# Patient Record
Sex: Male | Born: 1970
Health system: Southern US, Community
[De-identification: ages and names within clinical notes are randomized; demographics above are authoritative.]

## PROBLEM LIST (undated history)

## (undated) DIAGNOSIS — G4730 Sleep apnea, unspecified: Secondary | ICD-10-CM

## (undated) DIAGNOSIS — L409 Psoriasis, unspecified: Secondary | ICD-10-CM

## (undated) HISTORY — PX: PATELLAR TENDON REPAIR: SHX737

## (undated) HISTORY — PX: KNEE SURGERY: SHX244

---

## 2001-03-31 ENCOUNTER — Ambulatory Visit (HOSPITAL_BASED_OUTPATIENT_CLINIC_OR_DEPARTMENT_OTHER): Admission: RE | Admit: 2001-03-31 | Discharge: 2001-03-31 | Payer: Self-pay | Admitting: Orthopedic Surgery

## 2001-06-05 ENCOUNTER — Encounter: Admission: RE | Admit: 2001-06-05 | Discharge: 2001-06-19 | Payer: Self-pay | Admitting: Orthopedic Surgery

## 2004-02-16 ENCOUNTER — Ambulatory Visit (HOSPITAL_COMMUNITY): Admission: RE | Admit: 2004-02-16 | Discharge: 2004-02-16 | Payer: Self-pay | Admitting: Family Medicine

## 2004-02-29 ENCOUNTER — Ambulatory Visit (HOSPITAL_BASED_OUTPATIENT_CLINIC_OR_DEPARTMENT_OTHER): Admission: RE | Admit: 2004-02-29 | Discharge: 2004-02-29 | Payer: Self-pay | Admitting: Orthopedic Surgery

## 2015-01-12 ENCOUNTER — Encounter (HOSPITAL_COMMUNITY): Payer: Self-pay

## 2015-01-12 ENCOUNTER — Emergency Department (HOSPITAL_COMMUNITY)
Admission: EM | Admit: 2015-01-12 | Discharge: 2015-01-12 | Disposition: A | Discharge status: Home / Self Care | Payer: 59 | Attending: Emergency Medicine | Admitting: Emergency Medicine

## 2015-01-12 DIAGNOSIS — W540XXA Bitten by dog, initial encounter: Secondary | ICD-10-CM | POA: Insufficient documentation

## 2015-01-12 DIAGNOSIS — Y9389 Activity, other specified: Secondary | ICD-10-CM | POA: Diagnosis not present

## 2015-01-12 DIAGNOSIS — S71152A Open bite, left thigh, initial encounter: Secondary | ICD-10-CM

## 2015-01-12 DIAGNOSIS — Z79899 Other long term (current) drug therapy: Secondary | ICD-10-CM | POA: Diagnosis not present

## 2015-01-12 DIAGNOSIS — Z23 Encounter for immunization: Secondary | ICD-10-CM | POA: Insufficient documentation

## 2015-01-12 DIAGNOSIS — Y9289 Other specified places as the place of occurrence of the external cause: Secondary | ICD-10-CM | POA: Diagnosis not present

## 2015-01-12 DIAGNOSIS — Z7952 Long term (current) use of systemic steroids: Secondary | ICD-10-CM | POA: Insufficient documentation

## 2015-01-12 DIAGNOSIS — S71132A Puncture wound without foreign body, left thigh, initial encounter: Secondary | ICD-10-CM | POA: Diagnosis not present

## 2015-01-12 DIAGNOSIS — Y998 Other external cause status: Secondary | ICD-10-CM | POA: Diagnosis not present

## 2015-01-12 MED ORDER — AMOXICILLIN-POT CLAVULANATE 875-125 MG PO TABS: 1.0000 | ORAL_TABLET | Freq: Two times a day (BID) | ORAL | Status: DC

## 2015-01-12 MED ORDER — RABIES IMMUNE GLOBULIN 150 UNIT/ML IM INJ
20.0000 [IU]/kg | INJECTION | Freq: Once | INTRAMUSCULAR | Status: AC
  Administered 2015-01-12: 1350 [IU] via INTRAMUSCULAR
  Filled 2015-01-12 (×2): qty 9

## 2015-01-12 MED ORDER — AMOXICILLIN-POT CLAVULANATE 875-125 MG PO TABS
1.0000 | ORAL_TABLET | Freq: Once | ORAL | Status: AC
  Administered 2015-01-12: 1 via ORAL
  Filled 2015-01-12: qty 1

## 2015-01-12 MED ORDER — RABIES VACCINE, PCEC IM SUSR
1.0000 mL | Freq: Once | INTRAMUSCULAR | Status: AC
  Administered 2015-01-12: 1 mL via INTRAMUSCULAR
  Filled 2015-01-12: qty 1

## 2015-01-12 MED ORDER — LIDOCAINE-EPINEPHRINE-TETRACAINE (LET) SOLUTION
3.0000 mL | Freq: Once | NASAL | Status: AC
  Administered 2015-01-12: 3 mL via TOPICAL
  Filled 2015-01-12: qty 3

## 2015-01-12 MED ORDER — HYDROCODONE-ACETAMINOPHEN 5-325 MG PO TABS
1.0000 | ORAL_TABLET | Freq: Four times a day (QID) | ORAL | Status: DC | PRN
Start: 2015-01-12 — End: 2017-04-18

## 2015-01-12 MED ORDER — HYDROCODONE-ACETAMINOPHEN 5-325 MG PO TABS
2.0000 | ORAL_TABLET | Freq: Once | ORAL | Status: AC
  Administered 2015-01-12: 2 via ORAL
  Filled 2015-01-12: qty 2

## 2015-01-12 NOTE — ED Provider Notes (Signed)
CSN: [id number]     Arrival date & time 01/12/15  1001 History   First MD Initiated Contact with Patient 01/12/15 1008     Chief Complaint  Patient presents with  . Animal Bite     (Consider location/radiation/quality/duration/timing/severity/associated sxs/prior Treatment) HPI Comments: Patient is a 44 yo M presenting to the ED for evaluation of a dog bite to the left upper thigh that occurred just prior to arrival. Patient states his friends had to pull the dog off of his leg. The patient states he was twisting a lot to get away from the dog and is now sore on the left side of his chest wall and abdomen. Denies any other pain. He states he is able to control bleeding. The dog is owned by a family friend, vaccination status is unknown. Dog has been detained. Patient's tetanus shot is up-to-date.   History reviewed. No pertinent past medical history. Past Surgical History  Procedure Laterality Date  . Patellar tendon repair     History reviewed. No pertinent family history. History  Substance Use Topics  . Smoking status: Never Smoker   . Smokeless tobacco: Not on file  . Alcohol Use: No    Review of Systems  Skin: Positive for wound.  All other systems reviewed and are negative.     Allergies  Sulfa antibiotics  Home Medications   Prior to Admission medications   Medication Sig Start Date End Date Taking? Authorizing Provider  clobetasol cream (TEMOVATE) 0.05 % Apply 1 application topically daily. 01/03/15  Yes Historical Provider, MD  triamcinolone ointment (KENALOG) 0.1 % Apply 1 application topically daily. 11/09/14  Yes Historical Provider, MD  amoxicillin-clavulanate (AUGMENTIN) 875-125 MG per tablet Take 1 tablet by mouth every 12 (twelve) hours. 01/12/15   Dammon Makarewicz, PA-C  HYDROcodone-acetaminophen (NORCO/VICODIN) 5-325 MG per tablet Take 1-2 tablets by mouth every 6 (six) hours as needed for severe pain. 01/12/15   Gabriell Casimir, PA-C   BP 123/68  mmHg  Pulse 70  Temp(Src) 99.1 F (37.3 C) (Oral)  Resp 18  Wt 145 lb (65.772 kg)  SpO2 97% Physical Exam  Constitutional: He is oriented to person, place, and time. He appears well-developed and well-nourished. No distress.  HENT:  Head: Normocephalic and atraumatic.  Right Ear: External ear normal.  Left Ear: External ear normal.  Nose: Nose normal.  Mouth/Throat: Oropharynx is clear and moist.  Eyes: Conjunctivae are normal.  Neck: Normal range of motion. Neck supple.  Cardiovascular: Normal rate, regular rhythm and normal heart sounds.   Pulmonary/Chest: Effort normal and breath sounds normal.  Abdominal: Soft.  Musculoskeletal: Normal range of motion.  Neurological: He is alert and oriented to person, place, and time.  Skin: Skin is warm and dry. He is not diaphoretic.     Psychiatric: He has a normal mood and affect.  Nursing note and vitals reviewed.   ED Course  Procedures (including critical care time) Medications  rabies vaccine (RABAVERT) injection 1 mL (not administered)  rabies immune globulin (HYPERAB) injection 1,350 Units (not administered)  HYDROcodone-acetaminophen (NORCO/VICODIN) 5-325 MG per tablet 2 tablet (2 tablets Oral Given 01/12/15 1053)  lidocaine-EPINEPHrine-tetracaine (LET) solution (3 mLs Topical Given 01/12/15 1226)  amoxicillin-clavulanate (AUGMENTIN) 875-125 MG per tablet 1 tablet (1 tablet Oral Given 01/12/15 1053)    Labs Review Labs Reviewed - No data to display  Imaging Review No results found.   EKG Interpretation None      LACERATION REPAIR Performed by: Francee PiccoloPIEPENBRINK, Cayle Thunder L Authorized by:  Francee Piccolo L Consent: Verbal consent obtained. Risks and benefits: risks, benefits and alternatives were discussed Consent given by: patient Patient identity confirmed: provided demographic data Prepped and Draped in normal sterile fashion Wound explored  Laceration Location: left upper thigh  Laceration Length: 3  cm  No Foreign Bodies seen or palpated  Anesthesia: topical  Local anesthetic: LET  Anesthetic total: 3 ml  Irrigation method: syringe Amount of cleaning: standard  Skin closure: Staples  Number of sutures: 3  Technique: Staples  Patient tolerance: Patient tolerated the procedure well with no immediate complications.   Complications of closure of animal bite discussed with patient, who acknowledges this wound will only be loosely closed due to the extent of the wound as well as for cosmesis. He is agreeable to this plan.   MDM   Final diagnoses:  Animal bite of thigh, left, initial encounter    Filed Vitals:   01/12/15 1430  BP: 123/68  Pulse: 70  Temp:   Resp:    Afebrile, NAD, non-toxic appearing, AAOx4.  Neurovascularly intact. Normal sensation. No evidence of compartment syndrome. Tdap booster UTD. Wound cleaning complete with pressure irrigation, bottom of wound visualized, no foreign bodies appreciated. Laceration occurred < 8 hours prior to repair. Wound loosely closed.First dose of Augmentin given in ED for prophylaxis of infection from animal bite. Discussed staple home care w pt and answered questions. Pt to f-u for wound check in 1-2 days with staple removal in 10-14 days. Rabies vaccination given. Pt is hemodynamically stable w no complaints prior to dc.  Patient d/w with Dr. Donnald Garre, agrees with plan.       Francee Piccolo, PA-C 01/12/15 1600  Arby Barrette, MD 01/15/15 [phone]

## 2015-01-12 NOTE — ED Notes (Signed)
Suture cart at bedside 

## 2015-01-12 NOTE — ED Notes (Signed)
Assisted Erin, RN with irrigating wound and prepping for sutures/staples

## 2015-01-12 NOTE — ED Notes (Signed)
Pt given an urinal to use; visitors at bedside 

## 2015-01-12 NOTE — Discharge Instructions (Signed)
Please follow up with your primary care physician in 1-2 days. If you do not have one please call the [facility] number listed above. Please take your antibiotic until completion. Please take pain medication and/or muscle relaxants as prescribed and as needed for pain. Please do not drive on narcotic pain medication or on muscle relaxants. Please read all discharge instructions and return precautions.    Animal Bite An animal bite can result in a scratch on the skin, deep open cut, puncture of the skin, crush injury, or tearing away of the skin or a body part. Dogs are responsible for most animal bites. Children are bitten more often than adults. An animal bite can range from very mild to more serious. A small bite from your house pet is no cause for alarm. However, some animal bites can become infected or injure a bone or other tissue. You must seek medical care if:  The skin is broken and bleeding does not slow down or stop after 15 minutes.  The puncture is deep and difficult to clean (such as a cat bite).  Pain, warmth, redness, or pus develops around the wound.  The bite is from a stray animal or rodent. There may be a risk of rabies infection.  The bite is from a snake, raccoon, skunk, fox, coyote, or bat. There may be a risk of rabies infection.  The person bitten has a chronic illness such as diabetes, liver disease, or cancer, or the person takes medicine that lowers the immune system.  There is concern about the location and severity of the bite. It is important to clean and protect an animal bite wound right away to prevent infection. Follow these steps:  Clean the wound with plenty of water and soap.  Apply an antibiotic cream.  Apply gentle pressure over the wound with a clean towel or gauze to slow or stop bleeding.  Elevate the affected area above the heart to help stop any bleeding.  Seek medical care. Getting medical care within 8 hours of the  animal bite leads to the best possible outcome. DIAGNOSIS  Your caregiver will most likely:  Take a detailed history of the animal and the bite injury.  Perform a wound exam.  Take your medical history. Blood tests or X-rays may be performed. Sometimes, infected bite wounds are cultured and sent to a lab to identify the infectious bacteria.  TREATMENT  Medical treatment will depend on the location and type of animal bite as well as the patient's medical history. Treatment may include:  Wound care, such as cleaning and flushing the wound with saline solution, bandaging, and elevating the affected area.  Antibiotics.  Tetanus immunization.  Rabies immunization.  Leaving the wound open to heal. This is often done with animal bites, due to the high risk of infection. However, in certain cases, wound closure with stitches, wound adhesive, skin adhesive strips, or staples may be used. Infected bites that are left untreated may require intravenous (IV) antibiotics and surgical treatment in the hospital. HOME CARE INSTRUCTIONS  Follow your caregiver's instructions for wound care.  Take all medicines as directed.  If your caregiver prescribes antibiotics, take them as directed. Finish them even if you start to feel better.  Follow up with your caregiver for further exams or immunizations as directed. You may need a tetanus shot if:  You cannot remember when you had your last tetanus shot.  You have never had a tetanus shot.  The injury  broke your skin. If you get a tetanus shot, your arm may swell, get red, and feel warm to the touch. This is common and not a problem. If you need a tetanus shot and you choose not to have one, there is a rare chance of getting tetanus. Sickness from tetanus can be serious. SEEK MEDICAL CARE IF:  You notice warmth, redness, soreness, swelling, pus discharge, or a bad smell coming from the wound.  You have a red line on the skin coming from the  wound.  You have a fever, chills, or a general ill feeling.  You have nausea or vomiting.  You have continued or worsening pain.  You have trouble moving the injured part.  You have other questions or concerns. MAKE SURE YOU:  Understand these instructions.  Will watch your condition.  Will get help right away if you are not doing well or get worse. Document Released: 07/02/2011 Document Revised: 01/06/2012 Document Reviewed: 07/02/2011 [facility] Patient Information 2015 [place], [place]. This information is not intended to replace advice given to you by your health care provider. Make sure you discuss any questions you have with your health care provider. Staple Care and Removal Your caregiver has used staples today to repair your wound. Staples are used to help a wound heal faster by holding the edges of the wound together. The staples can be removed when the wound has healed well enough to stay together after the staples are removed. A dressing (wound covering), depending on the location of the wound, may have been applied. This may be changed once per day or as instructed. If the dressing sticks, it may be soaked off with soapy water or hydrogen peroxide. Only take over-the-counter or prescription medicines for pain, discomfort, or fever as directed by your caregiver.  If you did not receive a tetanus shot today because you did not recall when your last one was given, check with your caregiver when you have your staples removed to determine if one is needed. Return to your caregiver's office in 1 week or as suggested to have your staples removed. SEEK IMMEDIATE MEDICAL CARE IF:   You have redness, swelling, or increasing pain in the wound.  You have pus coming from the wound.  You have a fever.  You notice a bad smell coming from the wound or dressing.  Your wound edges break open after staples have been removed. Document Released: 07/09/2001 Document Revised: 01/06/2012  Document Reviewed: 07/24/2005 [facility] Patient Information 2015 [place], [place]. This information is not intended to replace advice given to you by your health care provider. Make sure you discuss any questions you have with your health care provider.

## 2015-01-12 NOTE — ED Notes (Signed)
Pt reports he was hanging out with friends when a friends dog bit him in the left upper thigh.  Pt has open puncture wound to left upper thigh.  Bleeding is controlled.  Unknown vaccination status of dog.

## 2015-01-12 NOTE — ED Notes (Signed)
Wound cleaned and sterile dressing applied.

## 2015-01-15 ENCOUNTER — Emergency Department (HOSPITAL_COMMUNITY)
Admission: EM | Admit: 2015-01-15 | Discharge: 2015-01-15 | Disposition: A | Discharge status: Home / Self Care | Payer: 59 | Source: Home / Self Care

## 2015-01-15 ENCOUNTER — Encounter (HOSPITAL_COMMUNITY): Payer: Self-pay | Admitting: *Deleted

## 2015-01-15 DIAGNOSIS — Z203 Contact with and (suspected) exposure to rabies: Secondary | ICD-10-CM

## 2015-01-15 HISTORY — DX: Psoriasis, unspecified: L40.9

## 2015-01-15 MED ORDER — RABIES VACCINE, PCEC IM SUSR
INTRAMUSCULAR | Status: AC
  Filled 2015-01-15: qty 1

## 2015-01-15 MED ORDER — RABIES VACCINE, PCEC IM SUSR
1.0000 mL | Freq: Once | INTRAMUSCULAR | Status: AC
  Administered 2015-01-15: 1 mL via INTRAMUSCULAR

## 2015-01-15 NOTE — ED Notes (Signed)
Presents for rabies vaccine.  No c/o's.

## 2015-01-15 NOTE — Discharge Instructions (Signed)
Return 3/24 for next injection.

## 2015-01-19 ENCOUNTER — Encounter (HOSPITAL_COMMUNITY): Payer: Self-pay | Admitting: *Deleted

## 2015-01-19 ENCOUNTER — Emergency Department (HOSPITAL_COMMUNITY)
Admission: EM | Admit: 2015-01-19 | Discharge: 2015-01-19 | Disposition: A | Discharge status: Home / Self Care | Payer: 59 | Source: Home / Self Care

## 2015-01-19 MED ORDER — RABIES VACCINE, PCEC IM SUSR: 1.0000 mL | Freq: Once | INTRAMUSCULAR | Status: DC

## 2015-01-19 NOTE — ED Notes (Signed)
Pt  Phoned     Stating  That  Animal control   Had  Advised  Him   That  He  Did  Not  Need  The  Rest  Of  His   Rabies     shots        -  He  Was  Advised  That       -         If  Any  Change  Let  [place]  Know

## 2015-01-19 NOTE — ED Notes (Signed)
Pt  Not  In  Waiting  Room     Checked  X  2

## 2017-04-18 ENCOUNTER — Encounter (HOSPITAL_COMMUNITY): Payer: Self-pay | Admitting: *Deleted

## 2017-04-18 ENCOUNTER — Ambulatory Visit (HOSPITAL_COMMUNITY)
Admission: EM | Admit: 2017-04-18 | Discharge: 2017-04-18 | Disposition: A | Discharge status: Home / Self Care | Payer: BLUE CROSS/BLUE SHIELD | Attending: Family Medicine | Admitting: Family Medicine

## 2017-04-18 DIAGNOSIS — J020 Streptococcal pharyngitis: Secondary | ICD-10-CM

## 2017-04-18 MED ORDER — AMOXICILLIN 875 MG PO TABS: 875.0000 mg | ORAL_TABLET | Freq: Two times a day (BID) | ORAL | 0 refills | Status: DC

## 2017-04-18 NOTE — ED Triage Notes (Signed)
Pt   Reports    sorethroat     Since  yest  Wife  Has     Similar  Symptoms

## 2017-04-18 NOTE — ED Provider Notes (Signed)
[facility]    CSN: [id number] Arrival date & time: 04/18/17  1605     History   Chief Complaint No chief complaint on file.   HPI Jared Stewart is a 46 y.o. male.   Is a 46 year old man who comes in for evaluation of sore throat. It began this morning and has gotten progressively worse all day associated with fatigue and fever. His wife has been tested positive for strep      Past Medical History:  Diagnosis Date  . Psoriasis     There are no active problems to display for this patient.   Past Surgical History:  Procedure Laterality Date  . KNEE SURGERY    . PATELLAR TENDON REPAIR         Home Medications    Prior to Admission medications   Medication Sig Start Date End Date Taking? Authorizing Provider  amoxicillin (AMOXIL) 875 MG tablet Take 1 tablet (875 mg total) by mouth 2 (two) times daily. 04/18/17   Elvina SidleLauenstein, Rache Klimaszewski, MD    Family History No family history on file.  Social History Social History  Substance Use Topics  . Smoking status: Never Smoker  . Smokeless tobacco: Not on file  . Alcohol use No     Allergies   Sulfa antibiotics   Review of Systems Review of Systems  Constitutional: Positive for fatigue and fever.  HENT: Positive for sore throat.      Physical Exam Triage Vital Signs ED Triage Vitals  Enc Vitals Group     BP      Pulse      Resp      Temp      Temp src      SpO2      Weight      Height      Head Circumference      Peak Flow      Pain Score      Pain Loc      Pain Edu?      Excl. in GC?    No data found.   Updated Vital Signs There were no vitals taken for this visit.  Physical Exam  Constitutional: He is oriented to person, place, and time. He appears well-developed and well-nourished.  HENT:  Right Ear: External ear normal.  Left Ear: External ear normal.  And erythematous posterior pharynx  Eyes: Conjunctivae are normal. Pupils are equal, round, and reactive to light.  Neck:  Normal range of motion. Neck supple.  Pulmonary/Chest: Effort normal.  Musculoskeletal: Normal range of motion.  Neurological: He is alert and oriented to person, place, and time.  Skin: Skin is warm and dry.  Nursing note and vitals reviewed.    UC Treatments / Results  Labs (all labs ordered are listed, but only abnormal results are displayed) Labs Reviewed - No data to display  EKG  EKG Interpretation None       Radiology No results found.  Procedures Procedures (including critical care time)  Medications Ordered in UC Medications - No data to display   Initial Impression / Assessment and Plan / UC Course  I have reviewed the triage vital signs and the nursing notes.  Pertinent labs & imaging results that were available during my care of the patient were reviewed by me and considered in my medical decision making (see chart for details).     Final Clinical Impressions(s) / UC Diagnoses   Final diagnoses:  Streptococcal sore throat  New Prescriptions New Prescriptions   AMOXICILLIN (AMOXIL) 875 MG TABLET    Take 1 tablet (875 mg total) by mouth 2 (two) times daily.     Elvina Sidle, MD 04/18/17 [id number]

## 2017-07-17 MED FILL — VALACYCLOVIR HCL 500 MG TAB: 500 | 9 days supply | Qty: 18 | Fill #0

## 2017-10-03 MED FILL — CLOBETASOL PROPIONATE 0.05: 0.05 | 20 days supply | Qty: 60 | Fill #0

## 2017-10-03 MED FILL — VALACYCLOVIR HCL 500 MG TAB: 500 | 9 days supply | Qty: 18 | Fill #1

## 2017-12-19 MED FILL — VALACYCLOVIR HCL 500 MG TAB: 500 | 9 days supply | Qty: 18 | Fill #2

## 2018-02-12 ENCOUNTER — Encounter (HOSPITAL_BASED_OUTPATIENT_CLINIC_OR_DEPARTMENT_OTHER): Payer: Self-pay

## 2018-02-12 DIAGNOSIS — R5383 Other fatigue: Secondary | ICD-10-CM

## 2018-02-12 DIAGNOSIS — G4710 Hypersomnia, unspecified: Secondary | ICD-10-CM

## 2018-02-16 ENCOUNTER — Encounter (HOSPITAL_BASED_OUTPATIENT_CLINIC_OR_DEPARTMENT_OTHER): Payer: No Typology Code available for payment source

## 2018-03-16 ENCOUNTER — Ambulatory Visit (HOSPITAL_BASED_OUTPATIENT_CLINIC_OR_DEPARTMENT_OTHER): Payer: No Typology Code available for payment source | Attending: Internal Medicine | Admitting: Internal Medicine

## 2018-03-16 VITALS — Ht 72.0 in | Wt 184.0 lb

## 2018-03-16 DIAGNOSIS — G4730 Sleep apnea, unspecified: Secondary | ICD-10-CM | POA: Diagnosis not present

## 2018-03-16 DIAGNOSIS — R400 Somnolence: Secondary | ICD-10-CM | POA: Diagnosis present

## 2018-03-16 DIAGNOSIS — R5383 Other fatigue: Secondary | ICD-10-CM | POA: Insufficient documentation

## 2018-03-16 DIAGNOSIS — G4710 Hypersomnia, unspecified: Secondary | ICD-10-CM

## 2018-03-16 DIAGNOSIS — G4733 Obstructive sleep apnea (adult) (pediatric): Secondary | ICD-10-CM

## 2018-03-26 MED FILL — CLOBETASOL PROPIONATE 0.05: 0.05 | 20 days supply | Qty: 60 | Fill #1

## 2018-03-26 MED FILL — VALACYCLOVIR HCL 500 MG TAB: 500 | 9 days supply | Qty: 18 | Fill #3

## 2018-03-29 NOTE — Procedures (Signed)
    NAME: Jared Stewart DATE OF BIRTH:  1971/04/19 MEDICAL RECORD NUMBER [id number]  LOCATION: [facility]  PHYSICIAN: Deretha EmoryJames C Maciah Schweigert  DATE OF STUDY: 03/16/2018  SLEEP STUDY TYPE: Out of Center Sleep Test                REFERRING PHYSICIAN: Deretha Emorysborne, Makhya Arave C, MD/D. Clovis RileyMitchell, MD  INDICATION FOR STUDY: excessive daytime sleepiness, nocturia, non-restorative sleep  EPWORTH SLEEPINESS SCORE:  13 HEIGHT: 6' (182.9 cm)  WEIGHT: 184 lb (83.5 kg)    Body mass index is 24.95 kg/m.  NECK SIZE: 14 in.  I have reviewed the patient's medications in eCW.   Conclusions: 1. Very mild sleep apnea (Respiratory Event Index (REI) 8.7/hr) - please note that REI approximates AHI but is not identical as a HST measures time of use and not time of sleep. 2. No significant desaturations (min O2 sat: 85%; time below 89%: 0 minutes [0% of monitored time]) 3. Qualitative RERA estimate: high  Recommendations: 1. The patient has excessive daytime sleepiness. His AHI is not very high, however, there did appear to be a large number of RERAs. Given his daytime sleepiness, the patient should be treated with weight loss (if appropriate), an oral appliance, or CPAP. If CPAP is chosen, this could be started by auto-CPAP or by CPAP titration in the lab.  2. Please note that a HST may underestimate the degree of obstructive sleep apnea due to the lack of EEG data. Therefore, respiratory effort related arousals (RERAs) will not be measured. If a prominent part of the patient's sleep disordered breathing are RERAs, then the HST will understate the severity of the patient's obstructive sleep apnea.   This study is a Type III Home Sleep Apnea Test measuring respiratory effort, airflow, heart rate, and oxygen saturation.   I certify that I have reviewed the entire raw data recording prior to the issuance of this report in accordance with the Standards of the American Academy of Sleep Medicine (AASM).    Deretha EmoryJames C Lavora Brisbon Sleep specialist, American Board of Internal Medicine  ELECTRONICALLY SIGNED ON:  03/29/2018, 9:47 PM  [facility] PH: [phone]   FX: [phone] ACCREDITED BY THE AMERICAN ACADEMY OF SLEEP MEDICINE

## 2018-04-25 ENCOUNTER — Ambulatory Visit (INDEPENDENT_AMBULATORY_CARE_PROVIDER_SITE_OTHER): Payer: Self-pay | Admitting: Family Medicine

## 2018-04-25 VITALS — BP 110/70 | HR 98 | Temp 98.4°F | Resp 20 | Ht 72.0 in | Wt 191.2 lb

## 2018-04-25 DIAGNOSIS — Z0000 Encounter for general adult medical examination without abnormal findings: Secondary | ICD-10-CM

## 2018-04-25 NOTE — Patient Instructions (Signed)

## 2018-04-25 NOTE — Progress Notes (Signed)
Jared Stewart is a 47 y.o. male who presents today with concerns of need for an employee annual physical exam.Patient denies any acute conditions that need to be addressed today this visit.  Review of Systems  Constitutional: Negative for chills, fever and malaise/fatigue.  HENT: Negative for congestion, ear discharge, ear pain, sinus pain and sore throat.   Eyes: Negative.   Respiratory: Negative for cough, sputum production and shortness of breath.   Cardiovascular: Negative.  Negative for chest pain.  Gastrointestinal: Negative for abdominal pain, diarrhea, nausea and vomiting.  Genitourinary: Negative for dysuria, frequency, hematuria and urgency.  Musculoskeletal: Negative for myalgias.  Skin: Negative.   Neurological: Negative for headaches.  Endo/Heme/Allergies: Negative.   Psychiatric/Behavioral: Negative.     O: Vitals:   04/25/18 1202  BP: 110/70  Pulse: 98  Resp: 20  Temp: 98.4 F (36.9 C)  SpO2: 98%     Physical Exam  Constitutional: He is oriented to person, place, and time. Vital signs are normal. He appears well-developed and well-nourished. He is active.  Non-toxic appearance. He does not have a sickly appearance.  HENT:  Head: Normocephalic.  Right Ear: Hearing, tympanic membrane, external ear and ear canal normal.  Left Ear: Hearing, tympanic membrane, external ear and ear canal normal.  Nose: Nose normal.  Mouth/Throat: Uvula is midline and oropharynx is clear and moist.  Neck: Normal range of motion. Neck supple.  Cardiovascular: Normal rate, regular rhythm, normal heart sounds and normal pulses.  Pulmonary/Chest: Effort normal and breath sounds normal.  Abdominal: Soft. Bowel sounds are normal.  Musculoskeletal: Normal range of motion.  Lymphadenopathy:       Head (right side): No submental and no submandibular adenopathy present.       Head (left side): No submental and no submandibular adenopathy present.    He has no cervical adenopathy.   Neurological: He is alert and oriented to person, place, and time.  Psychiatric: He has a normal mood and affect. His speech is normal and behavior is normal. Cognition and memory are normal.  PHQ-9 negative  Vitals reviewed.  A: 1. Physical exam    P: Exam findings, diagnosis etiology and medication use and indications reviewed with patient. Follow- Up and discharge instructions provided. No emergent/urgent issues found on exam.  Patient verbalized understanding of information provided and agrees with plan of care (POC), all questions answered.  1. Physical exam

## 2018-06-10 ENCOUNTER — Ambulatory Visit (INDEPENDENT_AMBULATORY_CARE_PROVIDER_SITE_OTHER): Payer: Self-pay | Admitting: Nurse Practitioner

## 2018-06-10 VITALS — BP 120/70 | HR 69 | Temp 98.2°F | Resp 16 | Wt 194.0 lb

## 2018-06-10 DIAGNOSIS — H00012 Hordeolum externum right lower eyelid: Secondary | ICD-10-CM

## 2018-06-10 MED ORDER — ERYTHROMYCIN 5 MG/GM OP OINT: 1.0000 "application " | TOPICAL_OINTMENT | Freq: Every day | OPHTHALMIC | 0 refills | Status: AC

## 2018-06-10 MED FILL — ERYTHROMYCIN EYE OINTMENT: 5 | 10 days supply | Qty: 4 | Fill #0

## 2018-06-10 NOTE — Progress Notes (Signed)
   Subjective:    Patient ID: Jared Stewart, male    DOB: 01/30/1971, 47 y.o.   MRN: [id number]  The patient is a 47 year old male who presents today for complaints of right eye swelling.  The patient states his symptoms started 1 day ago.  Patient denies any fever, chills, cough, congestion, runny nose.  The patient also denies any crusting or drainage from the right eye.  The patient states that this morning it was lightly matted but not to the point where it looks like it was infected.  The patient denies pain at this time, but does have some discomfort to the lower lid.  The patient also does not wear contact lenses, and denies any history of injury or foreign body.  The patient has use warm compresses to the right eye since the onset for with some improvement.  Eye Pain   The right eye is affected. This is a new problem. The current episode started yesterday. The problem occurs constantly. The problem has been gradually improving. There was no injury mechanism. The pain is at a severity of 1/10. The pain is mild. There is no known exposure to pink eye. He does not wear contacts. Associated symptoms include eye redness. Pertinent negatives include no blurred vision, eye discharge, double vision, foreign body sensation, itching or photophobia. Associated symptoms comments: Lower lid swelling. The treatment provided mild relief.    Review of Systems  Constitutional: Negative.   HENT: Negative.   Eyes: Positive for pain and redness. Negative for blurred vision, double vision, photophobia, discharge, itching and visual disturbance.  Respiratory: Negative.   Cardiovascular: Negative.   Skin: Negative.       Objective:   Physical Exam  Constitutional: He appears well-developed and well-nourished. No distress.  HENT:  Head: Normocephalic and atraumatic.  Right Ear: External ear normal.  Left Ear: External ear normal.  Nose: Nose normal.  Mouth/Throat: Oropharynx is clear and moist.  Eyes:  Pupils are equal, round, and reactive to light. Conjunctivae and EOM are normal. Right eye exhibits no discharge.  Right lower lid swelling       Assessment & Plan:  Right Hordeolum  Exam findings, diagnosis etiology and medication use and indications reviewed with patient. Follow- Up and discharge instructions provided. No emergent/urgent issues found on exam.  Patient verbalized understanding of information provided and agrees with plan of care (POC), all questions answered.  1. Hordeolum externum of right lower eyelid  - erythromycin ophthalmic ointment; Place 1 application into the right eye at bedtime for 10 days.  Dispense: 10 g; Refill: 0 -Apply antibiotic ointment as prescribed. -Apply warm compresses to the right eye to relieve swelling. -Ibuprofen or Tylenol for pain, fever or general discomfort. -Follow up in the ER if you have sudden loss of vision or change in vision.

## 2018-06-10 NOTE — Patient Instructions (Signed)
Stye -Apply antibiotic ointment as prescribed. -Apply warm compresses to the right eye to relieve swelling. -Ibuprofen or Tylenol for pain, fever or general discomfort. -Follow up in the ER if you have sudden loss of vision or change in vision.  A stye is a bump on your eyelid caused by a bacterial infection. A stye can form inside the eyelid (internal stye) or outside the eyelid (external stye). An internal stye may be caused by an infected oil-producing gland inside your eyelid. An external stye may be caused by an infection at the base of your eyelash (hair follicle). Styes are very common. Anyone can get them at any age. They usually occur in just one eye, but you may have more than one in either eye. What are the causes? The infection is almost always caused by bacteria called Staphylococcus aureus. This is a common type of bacteria that lives on your skin. What increases the risk? You may be at higher risk for a stye if you have had one before. You may also be at higher risk if you have:  Diabetes.  Long-term illness.  Long-term eye redness.  A skin condition called seborrhea.  High fat levels in your blood (lipids).  What are the signs or symptoms? Eyelid pain is the most common symptom of a stye. Internal styes are more painful than external styes. Other signs and symptoms may include:  Painful swelling of your eyelid.  A scratchy feeling in your eye.  Tearing and redness of your eye.  Pus draining from the stye.  How is this diagnosed? Your health care provider may be able to diagnose a stye just by examining your eye. The health care provider may also check to make sure:  You do not have a fever or other signs of a more serious infection.  The infection has not spread to other parts of your eye or areas around your eye.  How is this treated? Most styes will clear up in a few days without treatment. In some cases, you may need to use antibiotic drops or ointment to  prevent infection. Your health care provider may have to drain the stye surgically if your stye is:  Large.  Causing a lot of pain.  Interfering with your vision.  This can be done using a thin blade or a needle. Follow these instructions at home:  Take medicines only as directed by your health care provider.  Apply a clean, warm compress to your eye for 10 minutes, 4 times a day.  Do not wear contact lenses or eye makeup until your stye has healed.  Do not try to pop or drain the stye. Contact a health care provider if:  You have chills or a fever.  Your stye does not go away after several days.  Your stye affects your vision.  Your eyeball becomes swollen, red, or painful. This information is not intended to replace advice given to you by your health care provider. Make sure you discuss any questions you have with your health care provider. Document Released: 07/24/2005 Document Revised: 06/09/2016 Document Reviewed: 01/28/2014 Elsevier Interactive Patient Education  [employer]2018 Elsevier Inc.

## 2018-07-13 MED FILL — CLOBETASOL PROPIONATE 0.05: 0.05 | 20 days supply | Qty: 60 | Fill #2

## 2018-07-14 MED FILL — VALACYCLOVIR HCL 500 MG TAB: 500 | 9 days supply | Qty: 18 | Fill #0

## 2018-07-22 MED FILL — VALACYCLOVIR HCL 500 MG TAB: 500 | 90 days supply | Qty: 90 | Fill #0

## 2018-08-12 MED FILL — ZOLPIDEM TARTRATE 5 MG TAB: 5 | 30 days supply | Qty: 30 | Fill #0

## 2018-10-12 MED FILL — VALACYCLOVIR HCL 500 MG TAB: 500 | 90 days supply | Qty: 90 | Fill #1

## 2018-10-13 MED FILL — ZOLPIDEM TARTRATE 5 MG TAB: 5 | 30 days supply | Qty: 30 | Fill #0

## 2019-01-15 MED FILL — VALACYCLOVIR HCL 500 MG TAB: 500 | 90 days supply | Qty: 90 | Fill #2

## 2019-02-04 MED FILL — ZOLPIDEM TARTRATE 5 MG TAB: 5 | 30 days supply | Qty: 30 | Fill #0

## 2019-02-09 ENCOUNTER — Encounter: Payer: Self-pay | Admitting: Pharmacist

## 2019-02-09 ENCOUNTER — Other Ambulatory Visit: Payer: Self-pay

## 2019-02-09 ENCOUNTER — Ambulatory Visit (INDEPENDENT_AMBULATORY_CARE_PROVIDER_SITE_OTHER): Payer: No Typology Code available for payment source | Admitting: Pharmacist

## 2019-02-09 DIAGNOSIS — Z79899 Other long term (current) drug therapy: Secondary | ICD-10-CM

## 2019-02-09 MED ORDER — SECUKINUMAB (300 MG DOSE) 150 MG/ML ~~LOC~~ SOAJ: 300.0000 mg | SUBCUTANEOUS | 0 refills | Status: DC

## 2019-02-09 MED ORDER — SECUKINUMAB (300 MG DOSE) 150 MG/ML ~~LOC~~ SOAJ: 300.0000 mg | SUBCUTANEOUS | 2 refills | Status: DC

## 2019-02-09 MED FILL — COSENTYX 300 MG DOSE-2 PENS: 150 | 35 days supply | Qty: 10 | Fill #0

## 2019-02-09 NOTE — Progress Notes (Signed)
   S: Patient presents today for review of their specialty medication. His wife is present for visit.   Patient is currently taking Cosentyx (secukinumab) for psoriasis. Patient is managed by Dr. Sharyn Lull for this.   Dosing: Plaque psoriasis: SubQ: 300 mg once weekly at weeks 0, 1, 2, 3, and 4 followed by 300 mg every 4 weeks. Some patients may only require 150 mg per dose.  Adherence: has not started yet  Efficacy: has not started yet  Current adverse effects: S/sx of infection: denies GI upset: has not started Headache: has not started S/sx of hypersensitivity: has not started    O:     No results found for: WBC, HGB, HCT, MCV, PLT    Chemistry   No results found for: NA, K, CL, CO2, BUN, CREATININE, GLU No results found for: CALCIUM, ALKPHOS, AST, ALT, BILITOT     A/P: 1. Medication review: Patient is currently on Cosentyx for psoriasis but has not started it yet. He plans to go to the office for the first administration though his wife is a Engineer, civil (consulting) and feels comfortable giving him the injection if needed. Reviewed the medication with the patient, including the following: Cosentyx is a monoclonal antibody used in the treatment of ankylosing spondylitis, psoriasis, and psoriatic arthritis. The injection is subq and the medication should be allowed to reach room temp prior to injecting. Injection sites should be rotated. Possible adverse effects include headaches, GI upset, increased risk of infection and hypersensitivity reactions. Stressed importance to good infection prevention technique during COVID19 pandemic. No recommendations for any changes at this time.  Alvino Blood, PharmD, BCPS, BCACP, CPP Clinical Pharmacist Practitioner  [phone]

## 2019-04-01 MED FILL — COSENTYX 300 MG DOSE-2 PENS: 150 | 28 days supply | Qty: 2 | Fill #0

## 2019-04-16 MED FILL — VALACYCLOVIR HCL 500 MG TAB: 500 | 90 days supply | Qty: 90 | Fill #3

## 2019-04-16 MED FILL — ZOLPIDEM TARTRATE 5 MG TAB: 5 | 30 days supply | Qty: 30 | Fill #0

## 2019-05-03 MED FILL — COSENTYX 300 MG DOSE-2 PENS: 150 | 28 days supply | Qty: 2 | Fill #1

## 2019-05-31 MED FILL — COSENTYX 300 MG DOSE-2 PENS: 150 | 28 days supply | Qty: 2 | Fill #2

## 2019-06-30 ENCOUNTER — Other Ambulatory Visit: Payer: Self-pay | Admitting: Pharmacist

## 2019-06-30 MED ORDER — COSENTYX SENSOREADY (300 MG) 150 MG/ML ~~LOC~~ SOAJ: 300.0000 mg | SUBCUTANEOUS | 1 refills | Status: DC

## 2019-06-30 MED FILL — COSENTYX 300 MG DOSE-2 PENS: 150 | 28 days supply | Qty: 2 | Fill #0

## 2019-07-20 ENCOUNTER — Other Ambulatory Visit: Payer: Self-pay | Admitting: Pharmacist

## 2019-07-20 MED ORDER — COSENTYX SENSOREADY (300 MG) 150 MG/ML ~~LOC~~ SOAJ: 300.0000 mg | SUBCUTANEOUS | 5 refills | Status: DC

## 2019-07-21 MED FILL — COSENTYX 300 MG DOSE-2 PENS: 150 | 28 days supply | Qty: 2 | Fill #0

## 2019-07-28 MED FILL — CLOBETASOL PROPIONATE 0.05: 0.05 | 14 days supply | Qty: 15 | Fill #0

## 2019-08-13 MED FILL — ZOLPIDEM TARTRATE 5 MG TAB: 5 | 30 days supply | Qty: 30 | Fill #0

## 2019-08-20 MED FILL — VALACYCLOVIR HCL 500 MG TAB: 500 | 90 days supply | Qty: 90 | Fill #0

## 2019-08-24 MED FILL — COSENTYX 300 MG DOSE-2 PENS: 150 | 28 days supply | Qty: 2 | Fill #0

## 2019-09-16 MED FILL — COSENTYX 300 MG DOSE-2 PENS: 150 | 28 days supply | Qty: 2 | Fill #0

## 2019-10-05 ENCOUNTER — Other Ambulatory Visit: Payer: Self-pay

## 2019-10-05 DIAGNOSIS — Z20822 Contact with and (suspected) exposure to covid-19: Secondary | ICD-10-CM

## 2019-10-07 LAB — NOVEL CORONAVIRUS, NAA: SARS-CoV-2, NAA: NOT DETECTED

## 2019-10-18 MED FILL — COSENTYX 300 MG DOSE-2 PENS: 150 | 28 days supply | Qty: 2 | Fill #0

## 2019-11-12 MED FILL — COSENTYX 300 MG DOSE-2 PENS: 150 | 28 days supply | Qty: 2 | Fill #1

## 2019-12-06 ENCOUNTER — Other Ambulatory Visit: Payer: Self-pay

## 2019-12-06 ENCOUNTER — Encounter: Payer: Self-pay | Admitting: Emergency Medicine

## 2019-12-06 ENCOUNTER — Ambulatory Visit
Admission: EM | Admit: 2019-12-06 | Discharge: 2019-12-06 | Disposition: A | Discharge status: Home / Self Care | Payer: No Typology Code available for payment source

## 2019-12-06 DIAGNOSIS — W1809XA Striking against other object with subsequent fall, initial encounter: Secondary | ICD-10-CM

## 2019-12-06 DIAGNOSIS — S069X0A Unspecified intracranial injury without loss of consciousness, initial encounter: Secondary | ICD-10-CM

## 2019-12-06 DIAGNOSIS — R5383 Other fatigue: Secondary | ICD-10-CM

## 2019-12-06 DIAGNOSIS — R454 Irritability and anger: Secondary | ICD-10-CM

## 2019-12-06 DIAGNOSIS — H53149 Visual discomfort, unspecified: Secondary | ICD-10-CM

## 2019-12-06 NOTE — ED Provider Notes (Signed)
[facility]    CSN: [id number] Arrival date & time: 12/06/19  1005      History   Chief Complaint Chief Complaint  Patient presents with  . Head Injury    HPI Wagner Tanzi is a 49 y.o. male presenting for evaluation of persistent fatigue, photophobia, mood swings, nausea status post fall.  Patient states he tripped up the stairs (last 7) and hit his head and lip on a window frame Friday night.  Patient denies LOC, the states he immediately had some tremors of his left side, cold chills, dizziness, nausea.  Has had headaches, dizziness and fatigue since.  Has been using ice, Tylenol, ibuprofen with some relief of symptoms.  Endorsing photophobia, photophobia.  States he feels fatigued after doing something for about 10 to 15 minutes to the point where he feels he could go to sleep, though denies chest pain, palpitations, difficulty breathing.  Notes that his wife feels he has been having more mood swings and been more emotional/depressed.  Patient denies SI/HI.   Past Medical History:  Diagnosis Date  . Psoriasis     There are no problems to display for this patient.   Past Surgical History:  Procedure Laterality Date  . KNEE SURGERY    . PATELLAR TENDON REPAIR         Home Medications    Prior to Admission medications   Medication Sig Start Date End Date Taking? Authorizing Provider  aspirin EC 81 MG tablet Take 81 mg by mouth daily.    [provider]  Secukinumab, 300 MG Dose, (COSENTYX SENSOREADY, 300 MG,) 150 MG/ML SOAJ Inject 300 mg into the skin every 28 (twenty-eight) days. Inject 2 pens starting at day 28 (dose 5), then every 4 weeks as directed 02/09/19   Jeanann Lewandowsky E, MD  Secukinumab, 300 MG Dose, (COSENTYX SENSOREADY, 300 MG,) 150 MG/ML SOAJ Inject 300 mg into the skin every 28 (twenty-eight) days. 07/20/19   Quentin Angst, MD    Family History Family History  Family history unknown: Yes    Social History Social History     Tobacco Use  . Smoking status: Never Smoker  . Smokeless tobacco: Never Used  Substance Use Topics  . Alcohol use: No  . Drug use: Yes    Types: Marijuana     Allergies   Sulfa antibiotics   Review of Systems As per HPI   Physical Exam Triage Vital Signs ED Triage Vitals  Enc Vitals Group     BP 12/06/19 1013 (!) 138/94     Pulse Rate 12/06/19 1013 84     Resp 12/06/19 1013 18     Temp 12/06/19 1013 99 F (37.2 C)     Temp Source 12/06/19 1013 Temporal     SpO2 12/06/19 1013 97 %     Weight --      Height --      Head Circumference --      Peak Flow --      Pain Score 12/06/19 1016 1     Pain Loc --      Pain Edu? --      Excl. in GC? --    No data found.  Updated Vital Signs BP (!) 138/94 (BP Location: Left Arm)   Pulse 84   Temp 99 F (37.2 C) (Temporal)   Resp 18   SpO2 97%   Visual Acuity Right Eye Distance:   Left Eye Distance:   Bilateral Distance:  Right Eye Near:   Left Eye Near:    Bilateral Near:     Physical Exam Constitutional:      General: He is not in acute distress. HENT:     Head: Normocephalic and atraumatic.     Right Ear: Tympanic membrane, ear canal and external ear normal.     Left Ear: Tympanic membrane, ear canal and external ear normal.     Mouth/Throat:     Mouth: Mucous membranes are moist.     Pharynx: Oropharynx is clear.  Eyes:     General: No scleral icterus.    Extraocular Movements: Extraocular movements intact.     Conjunctiva/sclera: Conjunctivae normal.     Pupils: Pupils are equal, round, and reactive to light.     Comments: Patient squints when shining light in eyes bilaterally, though does not appear to be bothered by fluorescent lighting, or during EOM exam  Cardiovascular:     Rate and Rhythm: Normal rate.  Pulmonary:     Effort: Pulmonary effort is normal. No respiratory distress.     Breath sounds: No wheezing.  Musculoskeletal:        General: No deformity. Normal range of motion.      Cervical back: Normal range of motion. No rigidity or tenderness.  Lymphadenopathy:     Cervical: No cervical adenopathy.  Skin:    Capillary Refill: Capillary refill takes less than 2 seconds.     Coloration: Skin is not jaundiced.     Findings: No bruising or rash.  Neurological:     Mental Status: He is alert and oriented to person, place, and time.     Cranial Nerves: Cranial nerves are intact.     Sensory: Sensation is intact.     Motor: Motor function is intact. No tremor or abnormal muscle tone.     Coordination: Coordination is intact. Heel to [facility] Test normal. Rapid alternating movements normal.     Gait: Gait is intact. Gait normal.     Deep Tendon Reflexes:     Reflex Scores:      Brachioradialis reflexes are 2+ on the right side and 2+ on the left side.      Patellar reflexes are 1+ on the right side and 1+ on the left side.      Achilles reflexes are 1+ on the right side and 1+ on the left side. Psychiatric:        Mood and Affect: Mood normal.        Behavior: Behavior normal.      UC Treatments / Results  Labs (all labs ordered are listed, but only abnormal results are displayed) Labs Reviewed - No data to display  EKG   Radiology No results found.  Procedures Procedures (including critical care time)  Medications Ordered in UC Medications - No data to display  Initial Impression / Assessment and Plan / UC Course  I have reviewed the triage vital signs and the nursing notes.  Pertinent labs & imaging results that were available during my care of the patient were reviewed by me and considered in my medical decision making (see chart for details).     Patient afebrile, nontoxic, hemodynamically stable in office today.  Used "graded symptom checklist from standardized assessment of concussion addendum" from Up-to-Date, score of 15.  Score in conjunction with history is suspicious for moderate to severe TBI without LOC.  Patient is having persistent  fatigue, photophobia, irritability, emotional lability.  Denies using blood thinners; has  taken ibuprofen with some relief.  Requesting work/school note, though states that it would be inappropriate without further evaluation to be able to list work and school restrictions.  Instructed patient to go to ER given persistent symptoms as we cannot rule out intracranial hematoma in UC setting.  Reviewed A/P with wife, who states she is a Engineer, civil (consulting), [place]; I am unsure if 1+ lower extremity DTRs are patient's baseline or second to TBI - wife appears to be concerned given this finding.  Patient and wife elected to go to ER: We will defer work/school restrictions to their expertise.  Patient and wife electing to self transport which I am agreeable to given hemodynamic stability.  Return precautions discussed, patient & wife verbalized understanding and are agreeable to plan. Final Clinical Impressions(s) / UC Diagnoses   Final diagnoses:  Traumatic brain injury, without loss of consciousness, initial encounter [facility])  Other fatigue  Photophobia  Irritability     Discharge Instructions     Recommend further evaluation of your persistent symptoms after TBI prior to returning to normal school/work/home routine    ED Prescriptions    None     PDMP not reviewed this encounter.   Hall-Potvin, [place], [place] 12/07/19 2025

## 2019-12-06 NOTE — ED Triage Notes (Signed)
Pt presents to [facility] for assessment after he tripped up the last step and hit his head and lip on the window frame Friday night.   Laceration to inside of lip noted with bruising.    Right after impact he states he had tremors and chills lasting approx 1 hour.  States since then he has had headache, dizziness, and fatigue.    States he has been using ice and tylenol and ibuprofen since.  States he has sound and light sensitivity.  States he is so fatigued after doing something for 10-15 minutes that he could go back to sleep.  Wife states he has been having mood swings and has been more emotional.

## 2019-12-06 NOTE — Discharge Instructions (Addendum)
Recommend further evaluation of your persistent symptoms after TBI prior to returning to normal school/work/home routine

## 2019-12-06 NOTE — ED Notes (Signed)
Patient able to ambulate independently  

## 2019-12-07 ENCOUNTER — Other Ambulatory Visit: Payer: Self-pay | Admitting: Family Medicine

## 2019-12-07 ENCOUNTER — Telehealth: Payer: Self-pay | Admitting: Emergency Medicine

## 2019-12-07 DIAGNOSIS — S0990XA Unspecified injury of head, initial encounter: Secondary | ICD-10-CM

## 2019-12-07 NOTE — Telephone Encounter (Signed)
Called to check in on patient, left voicemail for return call.

## 2019-12-08 ENCOUNTER — Other Ambulatory Visit: Payer: No Typology Code available for payment source

## 2019-12-08 ENCOUNTER — Other Ambulatory Visit: Payer: Self-pay

## 2019-12-08 ENCOUNTER — Ambulatory Visit (INDEPENDENT_AMBULATORY_CARE_PROVIDER_SITE_OTHER): Payer: No Typology Code available for payment source

## 2019-12-08 DIAGNOSIS — S0990XA Unspecified injury of head, initial encounter: Secondary | ICD-10-CM

## 2019-12-13 MED FILL — COSENTYX 300 MG DOSE-2 PENS: 150 | 28 days supply | Qty: 2 | Fill #2

## 2019-12-15 ENCOUNTER — Other Ambulatory Visit: Payer: No Typology Code available for payment source

## 2020-01-13 ENCOUNTER — Other Ambulatory Visit: Payer: Self-pay | Admitting: Pharmacist

## 2020-01-13 ENCOUNTER — Ambulatory Visit: Payer: No Typology Code available for payment source

## 2020-01-13 MED ORDER — COSENTYX SENSOREADY (300 MG) 150 MG/ML ~~LOC~~ SOAJ: 300.0000 mg | SUBCUTANEOUS | 0 refills | Status: DC

## 2020-01-13 MED FILL — COSENTYX 300 MG DOSE-2 PENS: 150 | 28 days supply | Qty: 2 | Fill #0

## 2020-01-17 MED FILL — VALACYCLOVIR HCL 500 MG TAB: 500 | 90 days supply | Qty: 90 | Fill #1

## 2020-01-19 ENCOUNTER — Other Ambulatory Visit: Payer: Self-pay | Admitting: Pharmacist

## 2020-01-19 MED ORDER — COSENTYX SENSOREADY PEN 150 MG/ML ~~LOC~~ SOAJ: 300.0000 mg | SUBCUTANEOUS | 5 refills | Status: DC

## 2020-02-07 MED FILL — COSENTYX 300 MG DOSE-2 PENS: 150 | 28 days supply | Qty: 2 | Fill #0

## 2020-02-16 MED FILL — ZOLPIDEM TARTRATE 5 MG TAB: 5 | 30 days supply | Qty: 30 | Fill #0

## 2020-04-27 MED FILL — COSENTYX 300 MG DOSE-2 PENS: 150 | 28 days supply | Qty: 2 | Fill #3

## 2020-05-09 ENCOUNTER — Other Ambulatory Visit: Payer: Self-pay

## 2020-05-09 ENCOUNTER — Ambulatory Visit (HOSPITAL_BASED_OUTPATIENT_CLINIC_OR_DEPARTMENT_OTHER): Payer: No Typology Code available for payment source | Admitting: Pharmacist

## 2020-05-09 ENCOUNTER — Telehealth: Payer: Self-pay | Admitting: Pharmacist

## 2020-05-09 DIAGNOSIS — Z79899 Other long term (current) drug therapy: Secondary | ICD-10-CM

## 2020-05-09 NOTE — Progress Notes (Signed)
   S: Patient presents today for review of their specialty medication. His wife is present for visit.   Patient is currently taking Cosentyx (secukinumab) for psoriasis. Patient is managed by Dr. Sharyn Lull for this.   Dosing: Plaque psoriasis: SubQ: 300 mg every 4 weeks.  Adherence: confirms  Efficacy: this medication is working well for him  Current adverse effects: S/sx of infection: denies GI upset: denies  Headache: denies  S/sx of hypersensitivity: reports skin rxn a couple of injection cycles ago but this has not happened since.   O:  No results found for: WBC, HGB, HCT, MCV, PLT    Chemistry   No results found for: NA, K, CL, CO2, BUN, CREATININE, GLU No results found for: CALCIUM, ALKPHOS, AST, ALT, BILITOT     A/P: 1. Medication review: Patient is currently on Cosentyx for psoriasis and is tolerating it well. Reviewed the medication with the patient, including the following: Cosentyx is a monoclonal antibody used in the treatment of ankylosing spondylitis, psoriasis, and psoriatic arthritis. The injection is subq and the medication should be allowed to reach room temp prior to injecting. Injection sites should be rotated. Possible adverse effects include headaches, GI upset, increased risk of infection and hypersensitivity reactions. Stressed importance to good infection prevention technique during COVID19 pandemic. No recommendations for any changes at this time.  Butch Penny, PharmD, CPP Clinical Pharmacist [facility] [phone]

## 2020-05-09 NOTE — Telephone Encounter (Signed)
Called patient to schedule an appointment for the [facility]. I was unable to reach the patient so I left a HIPAA-compliant message requesting that the patient return my call.   

## 2020-05-29 MED FILL — COSENTYX 300 MG DOSE-2 PENS: 150 | 28 days supply | Qty: 2 | Fill #4

## 2020-06-02 MED FILL — VALACYCLOVIR HCL 500 MG TAB: 500 | 90 days supply | Qty: 90 | Fill #2

## 2020-06-21 MED FILL — COSENTYX 300 MG DOSE-2 PENS: 150 | 28 days supply | Qty: 2 | Fill #5

## 2020-07-13 MED FILL — ZOLPIDEM TARTRATE 5 MG TAB: 5 | 30 days supply | Qty: 30 | Fill #0

## 2020-07-18 ENCOUNTER — Other Ambulatory Visit: Payer: Self-pay | Admitting: Pharmacist

## 2020-07-18 MED ORDER — COSENTYX SENSOREADY PEN 150 MG/ML ~~LOC~~ SOAJ: SUBCUTANEOUS | 1 refills | Status: DC

## 2020-07-20 MED FILL — COSENTYX 300 MG DOSE-2 PENS: 150 | 28 days supply | Qty: 2 | Fill #0

## 2020-07-24 MED FILL — traMADol HCL 50 MG TABS: 50 | 5 days supply | Qty: 20 | Fill #0

## 2020-08-02 ENCOUNTER — Other Ambulatory Visit (HOSPITAL_COMMUNITY): Payer: Self-pay | Admitting: Physician Assistant

## 2020-08-02 MED FILL — ONDANSETRON HCL 4 MG TABLET: 4 | 15 days supply | Qty: 30 | Fill #0

## 2020-08-02 MED FILL — METHOCARBAMOL 500 MG TABS: 500 | 10 days supply | Qty: 40 | Fill #0

## 2020-08-02 MED FILL — CEPHALEXIN 500 MG CAPSULE: 500 | 3 days supply | Qty: 12 | Fill #0

## 2020-08-02 MED FILL — oxyCODONE HCL 5 MG TABS: 5 | 7 days supply | Qty: 40 | Fill #0

## 2020-08-04 ENCOUNTER — Other Ambulatory Visit (HOSPITAL_COMMUNITY): Payer: Self-pay | Admitting: Student

## 2020-08-04 MED FILL — oxyCODONE HCL 5 MG TABS: 5 | 4 days supply | Qty: 40 | Fill #0

## 2020-08-14 ENCOUNTER — Other Ambulatory Visit (HOSPITAL_COMMUNITY): Payer: Self-pay | Admitting: Physician Assistant

## 2020-08-14 MED FILL — METHOCARBAMOL 500 MG TABS: 500 | 10 days supply | Qty: 40 | Fill #0

## 2020-08-14 MED FILL — HYDROCODON-APAP 5-325: 5-325 | 10 days supply | Qty: 40 | Fill #0

## 2020-08-24 ENCOUNTER — Other Ambulatory Visit: Payer: Self-pay | Admitting: Pharmacist

## 2020-08-24 MED ORDER — COSENTYX SENSOREADY PEN 150 MG/ML ~~LOC~~ SOAJ: SUBCUTANEOUS | 0 refills | Status: DC

## 2020-08-24 MED FILL — COSENTYX 300 MG DOSE-2 PENS: 150 | 28 days supply | Qty: 2 | Fill #1

## 2020-09-14 MED FILL — COSENTYX 300 MG DOSE-2 PENS: 150 | 28 days supply | Qty: 2 | Fill #0

## 2020-09-18 ENCOUNTER — Encounter: Payer: Self-pay | Admitting: Emergency Medicine

## 2020-09-18 ENCOUNTER — Other Ambulatory Visit: Payer: Self-pay

## 2020-09-18 ENCOUNTER — Ambulatory Visit (INDEPENDENT_AMBULATORY_CARE_PROVIDER_SITE_OTHER): Payer: No Typology Code available for payment source | Admitting: Emergency Medicine

## 2020-09-18 DIAGNOSIS — D869 Sarcoidosis, unspecified: Secondary | ICD-10-CM | POA: Insufficient documentation

## 2020-09-18 DIAGNOSIS — R918 Other nonspecific abnormal finding of lung field: Secondary | ICD-10-CM | POA: Diagnosis not present

## 2020-09-18 NOTE — Progress Notes (Signed)
Subjective:    Patient ID: Jared Stewart, male    DOB: February 18, 1971, 49 y.o.   MRN: [id number]  HPI 49 year old never smoker with a history of psoriasis, eczema, mild OSA.  He is on Cosentyx, has been on for 1-1.5 yrs.  He is referred today for evaluation of abnormal CT scan of the chest. He was under eval to provide a kidney transplant for his brother.  Upon evaluation included a CT chest as below.  There is suspicion for possible granulomatous disease and this needs to be worked up before the renal donation could proceed  Report from CT scan of the chest done on 08/17/2020 [facility] available for review.  Showed bandlike bilateral upper lobe and right lower lobe opacities, bilateral upper lobe predominant perilymphatic micro nodularity, scattered sub-4 mm solid pulmonary nodules.  There were no overt consolidations or infiltrates.  There was some inferior left major fissural pleural thickening.  Consider possibly sarcoidosis, pneumoconiosis or some granulomatous process.  He feels well. He doesn't have any breathing difficulty, does not cough. No rash other than his psoriasis. No other sequela of possible sarcoidosis. He has a family history of sarcoidosis in his brother.   Quant GOLD 08/18/20 >> negative   Review of Systems As per HPI  Past Medical History:  Diagnosis Date  . Psoriasis   Eczema on Cosentyx  Family History  Family history unknown: Yes    Brother >> sarcoidosis.   Social History   Socioeconomic History  . Marital status: Single    Spouse name: Not on file  . Number of children: Not on file  . Years of education: Not on file  . Highest education level: Not on file  Occupational History  . Not on file  Tobacco Use  . Smoking status: Never Smoker  . Smokeless tobacco: Never Used  Substance and Sexual Activity  . Alcohol use: No  . Drug use: Yes    Types: Marijuana  . Sexual activity: Not on file  Other Topics Concern  . Not on file  Social History Narrative   . Not on file   Social Determinants of Health   Financial Resource Strain:   . Difficulty of Paying Living Expenses: Not on file  Food Insecurity:   . Worried About Programme researcher, broadcasting/film/video in the Last Year: Not on file  . Ran Out of Food in the Last Year: Not on file  Transportation Needs:   . Lack of Transportation (Medical): Not on file  . Lack of Transportation (Non-Medical): Not on file  Physical Activity:   . Days of Exercise per Week: Not on file  . Minutes of Exercise per Session: Not on file  Stress:   . Feeling of Stress : Not on file  Social Connections:   . Frequency of Communication with Friends and Family: Not on file  . Frequency of Social Gatherings with Friends and Family: Not on file  . Attends Religious Services: Not on file  . Active Member of Clubs or Organizations: Not on file  . Attends Banker Meetings: Not on file  . Marital Status: Not on file  Intimate Partner Violence:   . Fear of Current or Ex-Partner: Not on file  . Emotionally Abused: Not on file  . Physically Abused: Not on file  . Sexually Abused: Not on file    From [place] Grew up on a farm, exposed to tobacco, hogs.  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  No chemicals. No welding.  No tobacco, some THC use a couple times a month.  No black mold exposure  Allergies  Allergen Reactions  . Sulfa Antibiotics Swelling     Outpatient Medications Prior to Visit  Medication Sig Dispense Refill  . HYDROcodone-acetaminophen (NORCO/VICODIN) 5-325 MG tablet Take 1 tablet by mouth every 6 (six) hours.    . methocarbamol (ROBAXIN) 500 MG tablet     . ondansetron (ZOFRAN) 4 MG tablet Take by mouth.    . oxyCODONE (OXY IR/ROXICODONE) 5 MG immediate release tablet Take 1-2 tablets by mouth every 4 (four) hours as needed.    . Secukinumab (COSENTYX SENSOREADY PEN) 150 MG/ML SOAJ Use as directed subcutaneous 2 injections every 4 weeks. 2 mL 0  . valACYclovir  (VALTREX) 500 MG tablet Take 500 mg by mouth daily.    Marland Kitchen zolpidem (AMBIEN) 5 MG tablet TAKE 1 TABLET BY MOUTH AT BEDTIEM AS NEEDED    . aspirin EC 81 MG tablet Take 81 mg by mouth daily.    . COSENTYX SENSOREADY, 300 MG, 150 MG/ML SOAJ Inject into the skin.     No facility-administered medications prior to visit.         Objective:   Physical Exam Vitals:   09/18/20 1421  BP: 130/82  Pulse: 82  Temp: 97.6 F (36.4 C)  SpO2: 100%  Weight: 190 lb 3.2 oz (86.3 kg)  Height: 6' (1.829 m)   Gen: Pleasant, well-nourished, in no distress,  normal affect  ENT: No lesions,  mouth clear,  oropharynx clear, no postnasal drip  Neck: No JVD, no stridor  Lungs: No use of accessory muscles, no crackles or wheezing on normal respiration, no wheeze on forced expiration  Cardiovascular: RRR, heart sounds normal, no murmur or gallops, no peripheral edema  Musculoskeletal: His right knee is in an immobilizer post surgery  Neuro: alert, awake, non focal  Skin: Warm, no lesions or rash     Assessment & Plan:  Pulmonary nodules/lesions, multiple Some linear scarring micronodular disease described on the CT scan report from [facility].  Suspicious for granulomatous process.  No exposures to his knowledge to suggest HSP.  I need to review the images.  This may be most consistent with sarcoidosis.  Question whether this could be associated with psoriasis or even Cosentyx.  A tissue diagnosis would be important for him as he is trying to prepare for kidney donation to facilitate transplant in a family member.  Discussed possible bronchoscopy with him today depending on the CT findings.  We will get copies of the images soon as possible and then get back together to review, plan next steps.  Of note he does not have any other findings that would suggest sarcoid, skin, neuro, etc.  Levy Pupa, MD, PhD 09/18/2020, 2:51 PM [facility] [phone] or if no answer [phone]

## 2020-09-18 NOTE — Patient Instructions (Signed)
We will obtain copies of your CT chest from [facility] to review. We will follow-up to review these images and plan next steps including possible bronchoscopy as soon as possible.

## 2020-09-18 NOTE — Assessment & Plan Note (Addendum)
Some linear scarring micronodular disease described on the CT scan report from [facility].  Suspicious for granulomatous process.  No exposures to his knowledge to suggest HSP.  I need to review the images.  This may be most consistent with sarcoidosis.  Question whether this could be associated with psoriasis or even Cosentyx.  A tissue diagnosis would be important for him as he is trying to prepare for kidney donation to facilitate transplant in a family member.  Discussed possible bronchoscopy with him today depending on the CT findings.  We will get copies of the images soon as possible and then get back together to review, plan next steps.  Of note he does not have any other findings that would suggest sarcoid, skin, neuro, etc.

## 2020-09-27 ENCOUNTER — Other Ambulatory Visit (HOSPITAL_COMMUNITY): Payer: Self-pay | Admitting: Gastroenterology

## 2020-09-28 ENCOUNTER — Telehealth: Payer: Self-pay | Admitting: Emergency Medicine

## 2020-09-28 DIAGNOSIS — R918 Other nonspecific abnormal finding of lung field: Secondary | ICD-10-CM

## 2020-09-28 NOTE — Telephone Encounter (Signed)
Called and spoke with Patient's Wife Shanda Bumps (DPR).  Shanda Bumps stated Patient was seen by Dr. Delton Coombes and Dr. Delton Coombes was going to request CT chest results from [facility] to review. Shanda Bumps was wanting to know if CD was received from [facility] and if Dr. Delton Coombes had been able to review it. Shanda Bumps is aware Dr. Delton Coombes is not in office this week, but at the hospital.  Message routed to Dr. Delton Coombes and Marchelle Folks

## 2020-10-03 NOTE — Telephone Encounter (Signed)
Marchelle Folks please advise if we have a disk of this patient's CT. RB please advise on results. Thanks!

## 2020-10-03 NOTE — Telephone Encounter (Signed)
pts wife called back for results . No phone call or VM left . Please advise

## 2020-10-04 NOTE — Telephone Encounter (Signed)
Called and spoke with pt and wife about the info from [place] after reviewing imaging from [place]. Stated to them that he wants to have a CT performed locally so he can be able to compare to recent CT and also have f/u with him after scan to further review. Pt verbalized understanding. Order placed for super d ct and f/u appt has been made for pt. Nothing further needed.

## 2020-10-04 NOTE — Telephone Encounter (Signed)
Spoke with [facility] who stated they pushed the CT images through in [place] and it should be visible there. ATC pt's wife Victorino Dike) to give update. LVM to return message to office.

## 2020-10-04 NOTE — Telephone Encounter (Signed)
Please let them know that I was able to review the forwarded images. There are multiple areas that look like nodular inflammation. I believe we should repeat his CT scan here locally to see if any of these areas have resolved or persist. If there are persistent changes then I would recommend bronchoscopy to evaluate - we discussed this at his visit.  If he agrees with this plan then please order a SuperD Ct chest without contrast for mid December. Also try to set him up for OV so we can review and plan procedure if needed,

## 2020-10-06 ENCOUNTER — Other Ambulatory Visit (HOSPITAL_COMMUNITY): Payer: Self-pay | Admitting: Physician Assistant

## 2020-10-06 MED FILL — METHOCARBAMOL 500 MG TABS: 500 | 10 days supply | Qty: 40 | Fill #0

## 2020-10-10 ENCOUNTER — Other Ambulatory Visit: Payer: Self-pay | Admitting: Internal Medicine

## 2020-10-10 ENCOUNTER — Other Ambulatory Visit: Payer: Self-pay | Admitting: Pharmacist

## 2020-10-10 MED ORDER — COSENTYX SENSOREADY PEN 150 MG/ML ~~LOC~~ SOAJ: SUBCUTANEOUS | 5 refills | Status: DC

## 2020-10-10 MED FILL — COSENTYX 300 MG DOSE-2 PENS: 150 | 28 days supply | Qty: 2 | Fill #0

## 2020-10-12 ENCOUNTER — Other Ambulatory Visit: Payer: Self-pay | Admitting: Pharmacist

## 2020-10-12 MED ORDER — COSENTYX SENSOREADY PEN 150 MG/ML ~~LOC~~ SOAJ: SUBCUTANEOUS | 5 refills | Status: DC

## 2020-10-16 ENCOUNTER — Other Ambulatory Visit: Payer: No Typology Code available for payment source

## 2020-10-25 ENCOUNTER — Ambulatory Visit (INDEPENDENT_AMBULATORY_CARE_PROVIDER_SITE_OTHER)
Admission: RE | Admit: 2020-10-25 | Discharge: 2020-10-25 | Disposition: A | Discharge status: Home / Self Care | Payer: No Typology Code available for payment source | Source: Ambulatory Visit | Attending: Emergency Medicine | Admitting: Emergency Medicine

## 2020-10-25 ENCOUNTER — Other Ambulatory Visit: Payer: Self-pay

## 2020-10-25 DIAGNOSIS — R918 Other nonspecific abnormal finding of lung field: Secondary | ICD-10-CM

## 2020-11-03 ENCOUNTER — Other Ambulatory Visit: Payer: Self-pay

## 2020-11-03 ENCOUNTER — Encounter: Payer: Self-pay | Admitting: Emergency Medicine

## 2020-11-03 ENCOUNTER — Ambulatory Visit (INDEPENDENT_AMBULATORY_CARE_PROVIDER_SITE_OTHER): Payer: No Typology Code available for payment source | Admitting: Emergency Medicine

## 2020-11-03 VITALS — BP 118/84 | HR 93 | Temp 97.4°F | Ht 72.0 in

## 2020-11-03 DIAGNOSIS — R918 Other nonspecific abnormal finding of lung field: Secondary | ICD-10-CM

## 2020-11-03 LAB — COMPREHENSIVE METABOLIC PANEL
ALT: 14 U/L (ref 0–53)
AST: 17 U/L (ref 0–37)
Albumin: 4.7 g/dL (ref 3.5–5.2)
Alkaline Phosphatase: 56 U/L (ref 39–117)
BUN: 13 mg/dL (ref 6–23)
CO2: 30 mEq/L (ref 19–32)
Calcium: 9.8 mg/dL (ref 8.4–10.5)
Chloride: 102 mEq/L (ref 96–112)
Creatinine, Ser: 1.05 mg/dL (ref 0.40–1.50)
GFR: 83.54 mL/min (ref >60.00)
Glucose, Bld: 72 mg/dL (ref 70–99)
Potassium: 4.4 mEq/L (ref 3.5–5.1)
Sodium: 136 mEq/L (ref 135–145)
Total Bilirubin: 0.7 mg/dL (ref 0.2–1.2)
Total Protein: 8.4 g/dL — ABNORMAL HIGH (ref 6.0–8.3)

## 2020-11-03 LAB — CBC WITH DIFFERENTIAL/PLATELET
Basophils Absolute: 0.1 10*3/uL (ref 0.0–0.1)
Basophils Relative: 2.2 % (ref 0.0–3.0)
Eosinophils Absolute: 0.4 10*3/uL (ref 0.0–0.7)
Eosinophils Relative: 5.8 % — ABNORMAL HIGH (ref 0.0–5.0)
HCT: 45.2 % (ref 39.0–52.0)
Hemoglobin: 14.8 g/dL (ref 13.0–17.0)
Lymphocytes Relative: 24.1 % (ref 12.0–46.0)
Lymphs Abs: 1.5 10*3/uL (ref 0.7–4.0)
MCHC: 32.8 g/dL (ref 30.0–36.0)
MCV: 83.7 fl (ref 78.0–100.0)
Monocytes Absolute: 0.9 10*3/uL (ref 0.1–1.0)
Monocytes Relative: 14.7 % — ABNORMAL HIGH (ref 3.0–12.0)
Neutro Abs: 3.4 10*3/uL (ref 1.4–7.7)
Neutrophils Relative %: 53.2 % (ref 43.0–77.0)
Platelets: 272 10*3/uL (ref 150.0–400.0)
RBC: 5.4 Mil/uL (ref 4.22–5.81)
RDW: 14.6 % (ref 11.5–15.5)
WBC: 6.4 10*3/uL (ref 4.0–10.5)

## 2020-11-03 LAB — PROTIME-INR
INR: 1.1 ratio — ABNORMAL HIGH (ref 0.8–1.0)
Prothrombin Time: 12.3 s (ref 9.6–13.1)

## 2020-11-03 NOTE — Assessment & Plan Note (Signed)
Areas of groundglass, micro nodularity and also more consolidation bilateral upper lobes on his most recent CT.  Looks inflammatory, question related to his psoriasis.  He is asymptomatic.  He is at risk for opportunistic infection as he is on Cosentyx.  Need to rule out malignancy.  Discussed bronchoscopy with him.  He agrees, will work to determine which day is best 1/11 or 1/18  We will work on setting up bronchoscopy to better evaluate your pulmonary nodules.  We will try to get this scheduled for either 11/07/2020 or 11/14/2020.  Please call us back to let us know which date will work better for your schedule. Labs today Follow with Dr Delton Coombes in 1 month

## 2020-11-03 NOTE — Addendum Note (Signed)
Addended by: Demetrio Lapping E on: 11/03/2020 10:24 AM   Modules accepted: Orders

## 2020-11-03 NOTE — Patient Instructions (Addendum)
We will work on setting up bronchoscopy to better evaluate your pulmonary nodules.  We will try to get this scheduled for either 11/07/2020 or 11/14/2020.  Please call us back to let us know which date will work better for your schedule. Labs today Follow with Dr Delton Coombes in 1 month

## 2020-11-03 NOTE — Addendum Note (Signed)
Addended by: Maurene Capes on: 11/03/2020 10:20 AM   Modules accepted: Orders

## 2020-11-03 NOTE — H&P (View-Only) (Signed)
 Subjective:    Patient ID: Jared Stewart, male    DOB: 05/08/1971, 49 y.o.   MRN: [id number]  HPI 49-year-old never smoker with a history of psoriasis, eczema, mild OSA.  He is on Cosentyx, has been on for 1-1.5 yrs.  He is referred today for evaluation of abnormal CT scan of the chest. He was under eval to provide a kidney transplant for his brother.  Upon evaluation included a CT chest as below.  There is suspicion for possible granulomatous disease and this needs to be worked up before the renal donation could proceed  Report from CT scan of the chest done on 08/17/2020 at [facility] available for review.  Showed bandlike bilateral upper lobe and right lower lobe opacities, bilateral upper lobe predominant perilymphatic micro nodularity, scattered sub-4 mm solid pulmonary nodules.  There were no overt consolidations or infiltrates.  There was some inferior left major fissural pleural thickening.  Consider possibly sarcoidosis, pneumoconiosis or some granulomatous process.  He feels well. He doesn't have any breathing difficulty, does not cough. No rash other than his psoriasis. No other sequela of possible sarcoidosis. He has a family history of sarcoidosis in his brother.   Quant GOLD 08/18/20 >> negative  ROV 11/03/20 --follow-up visit 49-year-old gentleman with psoriasis and eczema on Cosentyx, OSA.  He had an incidental finding of bilateral rounded opacities on CT chest as he was being evaluated for possible kidney transplantation donation for his brother.  CT chest from [facility] as above. Repeat CT chest 10/25/2020 reviewed by me, shows persistent bandlike opacities with some wedge-shaped consolidative change into the right upper lobe with some hilar distortion and volume loss, associated micro nodularity, 4 x 2.3 cm in greatest dimension.  Also a bandlike area in the left upper lobe 3 x 1.9 cm.  Subcarinal nodal enlargement 1.5 cm.    Review of Systems As per HPI  Past Medical History:  Diagnosis  Date  . Psoriasis   Eczema on Cosentyx  Family History  Family history unknown: Yes    Brother >> sarcoidosis.   Social History   Socioeconomic History  . Marital status: Single    Spouse name: Not on file  . Number of children: Not on file  . Years of education: Not on file  . Highest education level: Not on file  Occupational History  . Not on file  Tobacco Use  . Smoking status: Never Smoker  . Smokeless tobacco: Never Used  Substance and Sexual Activity  . Alcohol use: No  . Drug use: Yes    Types: Marijuana  . Sexual activity: Not on file  Other Topics Concern  . Not on file  Social History Narrative  . Not on file   Social Determinants of Health   Financial Resource Strain: Not on file  Food Insecurity: Not on file  Transportation Needs: Not on file  Physical Activity: Not on file  Stress: Not on file  Social Connections: Not on file  Intimate Partner Violence: Not on file    From [place] Grew up on a farm, exposed to tobacco, hogs.  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 No chemicals. No welding.  No tobacco, some THC use a couple times a month.  No black mold exposure  Allergies  Allergen Reactions  . Sulfa Antibiotics Swelling     Outpatient Medications Prior to Visit  Medication Sig Dispense Refill  . HYDROcodone-acetaminophen (NORCO/VICODIN) 5-325 MG tablet Take 1 tablet by mouth every   6 (six) hours.    . methocarbamol (ROBAXIN) 500 MG tablet     . ondansetron (ZOFRAN) 4 MG tablet Take by mouth.    . oxyCODONE (OXY IR/ROXICODONE) 5 MG immediate release tablet Take 1-2 tablets by mouth every 4 (four) hours as needed.    . Secukinumab (COSENTYX SENSOREADY PEN) 150 MG/ML SOAJ Inject as directed subcutaneous every 4 weeks. 2 mL 5  . valACYclovir (VALTREX) 500 MG tablet Take 500 mg by mouth daily.    Marland Kitchen zolpidem (AMBIEN) 5 MG tablet TAKE 1 TABLET BY MOUTH AT BEDTIEM AS NEEDED     No facility-administered medications  prior to visit.         Objective:   Physical Exam Vitals:   11/03/20 0936  BP: 118/84  Pulse: 93  Temp: (!) 97.4 F (36.3 C)  TempSrc: Temporal  SpO2: 99%  Height: 6' (1.829 m)   Gen: Pleasant, well-nourished, in no distress,  normal affect  ENT: No lesions,  mouth clear,  oropharynx clear, no postnasal drip  Neck: No JVD, no stridor  Lungs: No use of accessory muscles, no crackles or wheezing on normal respiration, no wheeze on forced expiration  Cardiovascular: RRR, heart sounds normal, no murmur or gallops, no peripheral edema  Musculoskeletal: His right knee is in an immobilizer post surgery  Neuro: alert, awake, non focal  Skin: Warm, no lesions or rash     Assessment & Plan:  Pulmonary nodules/lesions, multiple Areas of groundglass, micro nodularity and also more consolidation bilateral upper lobes on his most recent CT.  Looks inflammatory, question related to his psoriasis.  He is asymptomatic.  He is at risk for opportunistic infection as he is on Cosentyx.  Need to rule out malignancy.  Discussed bronchoscopy with him.  He agrees, will work to determine which day is best 1/11 or 1/18  We will work on setting up bronchoscopy to better evaluate your pulmonary nodules.  We will try to get this scheduled for either 11/07/2020 or 11/14/2020.  Please call us back to let us know which date will work better for your schedule. Labs today Follow with Dr Delton Coombes in 1 month  Levy Pupa, MD, PhD 11/03/2020, 10:18 AM [facility] [phone] or if no answer [phone]

## 2020-11-03 NOTE — Progress Notes (Signed)
Subjective:    Patient ID: Jared Stewart, male    DOB: May 13, 1971, 50 y.o.   MRN: [id number]  HPI 50 year old never smoker with a history of psoriasis, eczema, mild OSA.  He is on Cosentyx, has been on for 1-1.5 yrs.  He is referred today for evaluation of abnormal CT scan of the chest. He was under eval to provide a kidney transplant for his brother.  Upon evaluation included a CT chest as below.  There is suspicion for possible granulomatous disease and this needs to be worked up before the renal donation could proceed  Report from CT scan of the chest done on 08/17/2020 at [facility] available for review.  Showed bandlike bilateral upper lobe and right lower lobe opacities, bilateral upper lobe predominant perilymphatic micro nodularity, scattered sub-4 mm solid pulmonary nodules.  There were no overt consolidations or infiltrates.  There was some inferior left major fissural pleural thickening.  Consider possibly sarcoidosis, pneumoconiosis or some granulomatous process.  He feels well. He doesn't have any breathing difficulty, does not cough. No rash other than his psoriasis. No other sequela of possible sarcoidosis. He has a family history of sarcoidosis in his brother.   Quant GOLD 08/18/20 >> negative  ROV 11/03/20 --follow-up visit 50 year old gentleman with psoriasis and eczema on Cosentyx, OSA.  He had an incidental finding of bilateral rounded opacities on CT chest as he was being evaluated for possible kidney transplantation donation for his brother.  CT chest from [facility] as above. Repeat CT chest 10/25/2020 reviewed by me, shows persistent bandlike opacities with some wedge-shaped consolidative change into the right upper lobe with some hilar distortion and volume loss, associated micro nodularity, 4 x 2.3 cm in greatest dimension.  Also a bandlike area in the left upper lobe 3 x 1.9 cm.  Subcarinal nodal enlargement 1.5 cm.    Review of Systems As per HPI  Past Medical History:  Diagnosis  Date  . Psoriasis   Eczema on Cosentyx  Family History  Family history unknown: Yes    Brother >> sarcoidosis.   Social History   Socioeconomic History  . Marital status: Single    Spouse name: Not on file  . Number of children: Not on file  . Years of education: Not on file  . Highest education level: Not on file  Occupational History  . Not on file  Tobacco Use  . Smoking status: Never Smoker  . Smokeless tobacco: Never Used  Substance and Sexual Activity  . Alcohol use: No  . Drug use: Yes    Types: Marijuana  . Sexual activity: Not on file  Other Topics Concern  . Not on file  Social History Narrative  . Not on file   Social Determinants of Health   Financial Resource Strain: Not on file  Food Insecurity: Not on file  Transportation Needs: Not on file  Physical Activity: Not on file  Stress: Not on file  Social Connections: Not on file  Intimate Partner Violence: Not on file    From [place] Grew up on a farm, exposed to tobacco, hogs.  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 No chemicals. No welding.  No tobacco, some THC use a couple times a month.  No black mold exposure  Allergies  Allergen Reactions  . Sulfa Antibiotics Swelling     Outpatient Medications Prior to Visit  Medication Sig Dispense Refill  . HYDROcodone-acetaminophen (NORCO/VICODIN) 5-325 MG tablet Take 1 tablet by mouth every  6 (six) hours.    . methocarbamol (ROBAXIN) 500 MG tablet     . ondansetron (ZOFRAN) 4 MG tablet Take by mouth.    . oxyCODONE (OXY IR/ROXICODONE) 5 MG immediate release tablet Take 1-2 tablets by mouth every 4 (four) hours as needed.    . Secukinumab (COSENTYX SENSOREADY PEN) 150 MG/ML SOAJ Inject as directed subcutaneous every 4 weeks. 2 mL 5  . valACYclovir (VALTREX) 500 MG tablet Take 500 mg by mouth daily.    Marland Kitchen zolpidem (AMBIEN) 5 MG tablet TAKE 1 TABLET BY MOUTH AT BEDTIEM AS NEEDED     No facility-administered medications  prior to visit.         Objective:   Physical Exam Vitals:   11/03/20 0936  BP: 118/84  Pulse: 93  Temp: (!) 97.4 F (36.3 C)  TempSrc: Temporal  SpO2: 99%  Height: 6' (1.829 m)   Gen: Pleasant, well-nourished, in no distress,  normal affect  ENT: No lesions,  mouth clear,  oropharynx clear, no postnasal drip  Neck: No JVD, no stridor  Lungs: No use of accessory muscles, no crackles or wheezing on normal respiration, no wheeze on forced expiration  Cardiovascular: RRR, heart sounds normal, no murmur or gallops, no peripheral edema  Musculoskeletal: His right knee is in an immobilizer post surgery  Neuro: alert, awake, non focal  Skin: Warm, no lesions or rash     Assessment & Plan:  Pulmonary nodules/lesions, multiple Areas of groundglass, micro nodularity and also more consolidation bilateral upper lobes on his most recent CT.  Looks inflammatory, question related to his psoriasis.  He is asymptomatic.  He is at risk for opportunistic infection as he is on Cosentyx.  Need to rule out malignancy.  Discussed bronchoscopy with him.  He agrees, will work to determine which day is best 1/11 or 1/18  We will work on setting up bronchoscopy to better evaluate your pulmonary nodules.  We will try to get this scheduled for either 11/07/2020 or 11/14/2020.  Please call us back to let us know which date will work better for your schedule. Labs today Follow with Dr Delton Coombes in 1 month  Levy Pupa, MD, PhD 11/03/2020, 10:18 AM [facility] [phone] or if no answer [phone]

## 2020-11-06 ENCOUNTER — Telehealth: Payer: Self-pay | Admitting: Emergency Medicine

## 2020-11-06 NOTE — Telephone Encounter (Signed)
Left message for the patient to call back.

## 2020-11-06 NOTE — Telephone Encounter (Signed)
I could do 1/25 in Endo at [facility]. Alternative would be 1/14 in the afternoon at [facility], probably in OR

## 2020-11-07 ENCOUNTER — Encounter (HOSPITAL_COMMUNITY): Payer: Self-pay

## 2020-11-07 ENCOUNTER — Ambulatory Visit (HOSPITAL_COMMUNITY): Admit: 2020-11-07 | Payer: No Typology Code available for payment source | Admitting: Emergency Medicine

## 2020-11-07 SURGERY — BRONCHOSCOPY, WITH EBUS
Anesthesia: General

## 2020-11-08 NOTE — Telephone Encounter (Signed)
Pts wife Dahl Higinbotham returning call from office. Please advise call back [phone]

## 2020-11-09 NOTE — Telephone Encounter (Signed)
lmam for wife

## 2020-11-10 NOTE — Telephone Encounter (Signed)
Dr Delton Coombes I got this scheduled for 11/21/2020 at 9:30 Case #[id number] Covid test is scheduled 11/18/2020 pt is aware and the Ct was done at [facility] st did you received it

## 2020-11-10 NOTE — Telephone Encounter (Signed)
Thank you - I received the disk

## 2020-11-14 MED FILL — COSENTYX 300 MG DOSE-2 PENS: 150 | 28 days supply | Qty: 2 | Fill #0

## 2020-11-16 ENCOUNTER — Telehealth: Payer: Self-pay | Admitting: Internal Medicine

## 2020-11-16 ENCOUNTER — Encounter (HOSPITAL_COMMUNITY): Payer: Self-pay | Admitting: Emergency Medicine

## 2020-11-16 NOTE — Progress Notes (Signed)
EKG:  N/A CXR:  N/A ECHO:  Denies Stress Test:  Denies Cardiac Cath:  Denies  Fasting Blood Sugar-  N/A Checks Blood Sugar__N/A times a day  OSA/CPAP:  Yes, wears nightly  Blood Thinners:  No ASA:  Pt will stop ASA  Covid test 11/20/20  Patient denies shortness of breath, fever, cough, and chest pain  Patient verbalized understanding of instructions provided today

## 2020-11-16 NOTE — Telephone Encounter (Signed)
Traya with [employer] is calling and will send over PA questions. Pt needs PA for cosentyx sensoready pens. Key [id number]. Chanda Busing can be reached at [phone]

## 2020-11-17 NOTE — Telephone Encounter (Signed)
I don't think we write for his Cosentyx. This may not have been meant for our office.

## 2020-11-18 ENCOUNTER — Other Ambulatory Visit (HOSPITAL_COMMUNITY): Payer: No Typology Code available for payment source

## 2020-11-19 NOTE — Progress Notes (Signed)
Spoke with patient regarding Covid test for procedure. Will be going early Monday 11/20/20 to have test done.

## 2020-11-20 ENCOUNTER — Other Ambulatory Visit: Payer: Self-pay | Admitting: Emergency Medicine

## 2020-11-20 ENCOUNTER — Other Ambulatory Visit (HOSPITAL_COMMUNITY)
Admission: RE | Admit: 2020-11-20 | Discharge: 2020-11-20 | Disposition: A | Discharge status: Home / Self Care | Payer: No Typology Code available for payment source | Source: Ambulatory Visit | Attending: Emergency Medicine | Admitting: Emergency Medicine

## 2020-11-20 ENCOUNTER — Telehealth: Payer: Self-pay | Admitting: Emergency Medicine

## 2020-11-20 DIAGNOSIS — Z01812 Encounter for preprocedural laboratory examination: Secondary | ICD-10-CM | POA: Diagnosis not present

## 2020-11-20 DIAGNOSIS — Z20822 Contact with and (suspected) exposure to covid-19: Secondary | ICD-10-CM | POA: Diagnosis not present

## 2020-11-20 LAB — SARS CORONAVIRUS 2 (TAT 6-24 HRS): SARS Coronavirus 2: NEGATIVE

## 2020-11-20 NOTE — Telephone Encounter (Signed)
Called pt to discuss procedure for tomorrow. No answer, left message. Will call him back

## 2020-11-20 NOTE — Telephone Encounter (Signed)
I was able reach pt by phone, informed him that I have asked Dr Tonia Brooms to perform his ENB as I am unavailable. He understands the plan and agrees. I will follow up results with him.

## 2020-11-21 ENCOUNTER — Ambulatory Visit (HOSPITAL_COMMUNITY): Payer: No Typology Code available for payment source

## 2020-11-21 ENCOUNTER — Ambulatory Visit (HOSPITAL_COMMUNITY)
Admission: RE | Admit: 2020-11-21 | Discharge: 2020-11-21 | Disposition: A | Discharge status: Home / Self Care | Payer: No Typology Code available for payment source | Attending: Pulmonary Disease | Admitting: Pulmonary Disease

## 2020-11-21 ENCOUNTER — Other Ambulatory Visit: Payer: Self-pay

## 2020-11-21 ENCOUNTER — Encounter (HOSPITAL_COMMUNITY): Payer: Self-pay | Admitting: Pulmonary Disease

## 2020-11-21 ENCOUNTER — Ambulatory Visit (HOSPITAL_COMMUNITY): Payer: No Typology Code available for payment source | Admitting: Certified Registered"

## 2020-11-21 ENCOUNTER — Telehealth: Payer: Self-pay | Admitting: Pulmonary Disease

## 2020-11-21 ENCOUNTER — Encounter (HOSPITAL_COMMUNITY)
Admission: RE | Disposition: A | Discharge status: Home / Self Care | Payer: Self-pay | Source: Home / Self Care | Attending: Pulmonary Disease

## 2020-11-21 DIAGNOSIS — L309 Dermatitis, unspecified: Secondary | ICD-10-CM | POA: Diagnosis not present

## 2020-11-21 DIAGNOSIS — L409 Psoriasis, unspecified: Secondary | ICD-10-CM | POA: Diagnosis not present

## 2020-11-21 DIAGNOSIS — R590 Localized enlarged lymph nodes: Secondary | ICD-10-CM | POA: Insufficient documentation

## 2020-11-21 DIAGNOSIS — Z9889 Other specified postprocedural states: Secondary | ICD-10-CM

## 2020-11-21 DIAGNOSIS — G4733 Obstructive sleep apnea (adult) (pediatric): Secondary | ICD-10-CM | POA: Insufficient documentation

## 2020-11-21 DIAGNOSIS — D869 Sarcoidosis, unspecified: Secondary | ICD-10-CM | POA: Diagnosis present

## 2020-11-21 DIAGNOSIS — Z882 Allergy status to sulfonamides status: Secondary | ICD-10-CM | POA: Diagnosis not present

## 2020-11-21 DIAGNOSIS — R918 Other nonspecific abnormal finding of lung field: Secondary | ICD-10-CM | POA: Insufficient documentation

## 2020-11-21 DIAGNOSIS — Z79899 Other long term (current) drug therapy: Secondary | ICD-10-CM | POA: Insufficient documentation

## 2020-11-21 HISTORY — PX: BRONCHIAL NEEDLE ASPIRATION BIOPSY: SHX5106

## 2020-11-21 HISTORY — PX: VIDEO BRONCHOSCOPY WITH ENDOBRONCHIAL NAVIGATION: SHX6175

## 2020-11-21 HISTORY — PX: BRONCHIAL BIOPSY: SHX5109

## 2020-11-21 HISTORY — PX: BRONCHIAL WASHINGS: SHX5105

## 2020-11-21 HISTORY — DX: Sleep apnea, unspecified: G47.30

## 2020-11-21 HISTORY — PX: BRONCHIAL BRUSHINGS: SHX5108

## 2020-11-21 HISTORY — PX: VIDEO BRONCHOSCOPY WITH ENDOBRONCHIAL ULTRASOUND: SHX6177

## 2020-11-21 LAB — BODY FLUID CELL COUNT WITH DIFFERENTIAL
Eos, Fluid: 9 %
Eos, Fluid: 0 %
Eos, Fluid: 0 %
Lymphs, Fluid: 10 %
Lymphs, Fluid: 5 %
Lymphs, Fluid: 6 %
Monocyte-Macrophage-Serous Fluid: 25 % — ABNORMAL LOW (ref 50–90)
Monocyte-Macrophage-Serous Fluid: 72 % (ref 50–90)
Monocyte-Macrophage-Serous Fluid: 84 % (ref 50–90)
Neutrophil Count, Fluid: 56 % — ABNORMAL HIGH (ref 0–25)
Neutrophil Count, Fluid: 11 % (ref 0–25)
Neutrophil Count, Fluid: 22 % (ref 0–25)
Total Nucleated Cell Count, Fluid: 22 cu mm (ref 0–1000)
Total Nucleated Cell Count, Fluid: 36 cu mm (ref 0–1000)
Total Nucleated Cell Count, Fluid: 94 cu mm (ref 0–1000)

## 2020-11-21 SURGERY — BRONCHOSCOPY, WITH EBUS
Anesthesia: General

## 2020-11-21 MED ORDER — CHLORHEXIDINE GLUCONATE 0.12 % MT SOLN
15.0000 mL | Freq: Once | OROMUCOSAL | Status: AC
  Administered 2020-11-21: 15 mL via OROMUCOSAL

## 2020-11-21 MED ORDER — ROCURONIUM BROMIDE 10 MG/ML (PF) SYRINGE
PREFILLED_SYRINGE | INTRAVENOUS | Status: DC | PRN
  Administered 2020-11-21: 10 mg via INTRAVENOUS
  Administered 2020-11-21: 50 mg via INTRAVENOUS
  Administered 2020-11-21: 10 mg via INTRAVENOUS

## 2020-11-21 MED ORDER — LACTATED RINGERS IV SOLN: INTRAVENOUS | Status: DC | PRN

## 2020-11-21 MED ORDER — FENTANYL CITRATE (PF) 100 MCG/2ML IJ SOLN
INTRAMUSCULAR | Status: DC | PRN
  Administered 2020-11-21: 50 ug via INTRAVENOUS
  Administered 2020-11-21: 100 ug via INTRAVENOUS

## 2020-11-21 MED ORDER — ONDANSETRON HCL 4 MG/2ML IJ SOLN
INTRAMUSCULAR | Status: DC | PRN
  Administered 2020-11-21: 4 mg via INTRAVENOUS

## 2020-11-21 MED ORDER — DEXAMETHASONE SODIUM PHOSPHATE 10 MG/ML IJ SOLN
INTRAMUSCULAR | Status: DC | PRN
  Administered 2020-11-21: 5 mg via INTRAVENOUS

## 2020-11-21 MED ORDER — LIDOCAINE 2% (20 MG/ML) 5 ML SYRINGE
INTRAMUSCULAR | Status: DC | PRN
  Administered 2020-11-21: 80 mg via INTRAVENOUS

## 2020-11-21 MED ORDER — SUGAMMADEX SODIUM 200 MG/2ML IV SOLN
INTRAVENOUS | Status: DC | PRN
  Administered 2020-11-21: 200 mg via INTRAVENOUS

## 2020-11-21 MED ORDER — MIDAZOLAM HCL 5 MG/5ML IJ SOLN
INTRAMUSCULAR | Status: DC | PRN
  Administered 2020-11-21: 2 mg via INTRAVENOUS

## 2020-11-21 MED ORDER — PROPOFOL 10 MG/ML IV BOLUS
INTRAVENOUS | Status: DC | PRN
  Administered 2020-11-21: 200 mg via INTRAVENOUS

## 2020-11-21 MED ORDER — CHLORHEXIDINE GLUCONATE 0.12 % MT SOLN
OROMUCOSAL | Status: AC
  Filled 2020-11-21: qty 15

## 2020-11-21 SURGICAL SUPPLY — 51 items
ADAPTER BRONCH F/PENTAX (ADAPTER) ×3 IMPLANT
ADAPTER VALVE BIOPSY EBUS (MISCELLANEOUS) IMPLANT
ADPR BSCP EDG PNTX (ADAPTER) ×2
ADPTR VALVE BIOPSY EBUS (MISCELLANEOUS)
BRUSH CYTOL CELLEBRITY 1.5X140 (MISCELLANEOUS) ×3 IMPLANT
BRUSH SUPERTRAX BIOPSY (INSTRUMENTS) IMPLANT
BRUSH SUPERTRAX NDL-TIP CYTO (INSTRUMENTS) ×3 IMPLANT
CANISTER SUCT 3000ML PPV (MISCELLANEOUS) ×3 IMPLANT
CHANNEL WORK EXTEND EDGE 180 (KITS) IMPLANT
CHANNEL WORK EXTEND EDGE 45 (KITS) IMPLANT
CHANNEL WORK EXTEND EDGE 90 (KITS) IMPLANT
CONT SPEC 4OZ CLIKSEAL STRL BL (MISCELLANEOUS) ×3 IMPLANT
COVER BACK TABLE 60X90IN (DRAPES) ×3 IMPLANT
COVER DOME SNAP 22 D (MISCELLANEOUS) ×3 IMPLANT
FILTER STRAW FLUID ASPIR (MISCELLANEOUS) IMPLANT
FORCEPS BIOP RJ4 1.8 (CUTTING FORCEPS) IMPLANT
FORCEPS BIOP SUPERTRX PREMAR (INSTRUMENTS) ×3 IMPLANT
GAUZE SPONGE 4X4 12PLY STRL (GAUZE/BANDAGES/DRESSINGS) ×3 IMPLANT
GLOVE BIO SURGEON STRL SZ7.5 (GLOVE) ×3 IMPLANT
GLOVE SURG SS PI 7.5 STRL IVOR (GLOVE) ×6 IMPLANT
GOWN STRL REUS W/ TWL LRG LVL3 (GOWN DISPOSABLE) ×4 IMPLANT
GOWN STRL REUS W/TWL LRG LVL3 (GOWN DISPOSABLE) ×6
KIT CLEAN ENDO COMPLIANCE (KITS) ×6 IMPLANT
KIT LOCATABLE GUIDE (CANNULA) IMPLANT
KIT MARKER FIDUCIAL DELIVERY (KITS) IMPLANT
KIT PROCEDURE EDGE 180 (KITS) IMPLANT
KIT PROCEDURE EDGE 45 (KITS) IMPLANT
KIT PROCEDURE EDGE 90 (KITS) IMPLANT
KIT TURNOVER KIT B (KITS) ×3 IMPLANT
MARKER SKIN DUAL TIP RULER LAB (MISCELLANEOUS) ×3 IMPLANT
NEEDLE EBUS SONO TIP PENTAX (NEEDLE) ×3 IMPLANT
NEEDLE SUPERTRX PREMARK BIOPSY (NEEDLE) ×3 IMPLANT
NS IRRIG 1000ML POUR BTL (IV SOLUTION) ×3 IMPLANT
OIL SILICONE PENTAX (PARTS (SERVICE/REPAIRS)) ×3 IMPLANT
PAD ARMBOARD 7.5X6 YLW CONV (MISCELLANEOUS) ×6 IMPLANT
PATCHES PATIENT (LABEL) ×9 IMPLANT
SOL ANTI FOG 6CC (MISCELLANEOUS) ×2 IMPLANT
SOLUTION ANTI FOG 6CC (MISCELLANEOUS) ×1
SYR 20CC LL (SYRINGE) ×6 IMPLANT
SYR 20ML ECCENTRIC (SYRINGE) ×6 IMPLANT
SYR 50ML SLIP (SYRINGE) ×3 IMPLANT
SYR 5ML LUER SLIP (SYRINGE) ×3 IMPLANT
TOWEL OR 17X24 6PK STRL BLUE (TOWEL DISPOSABLE) ×3 IMPLANT
TRAP SPECIMEN MUCOUS 40CC (MISCELLANEOUS) IMPLANT
TUBE CONNECTING 20X1/4 (TUBING) ×6 IMPLANT
UNDERPAD 30X30 (UNDERPADS AND DIAPERS) ×3 IMPLANT
VALVE BIOPSY  SINGLE USE (MISCELLANEOUS) ×2
VALVE BIOPSY SINGLE USE (MISCELLANEOUS) ×2 IMPLANT
VALVE DISPOSABLE (MISCELLANEOUS) ×3 IMPLANT
VALVE SUCTION BRONCHIO DISP (MISCELLANEOUS) ×3 IMPLANT
WATER STERILE IRR 1000ML POUR (IV SOLUTION) ×3 IMPLANT

## 2020-11-21 NOTE — Op Note (Signed)
Video Bronchoscopy with Electromagnetic Navigation Procedure Note Video Bronchoscopy with Endobronchial Ultrasound Procedure Note  Date of Operation: 11/21/2020  Pre-op Diagnosis: Multiple pulmonary nodules  Post-op Diagnosis: Multiple pulmonary nodules  Surgeon: Garner Nash, DO   Assistants: None   Anesthesia: General endotracheal anesthesia  Operation: Flexible video fiberoptic bronchoscopy with electromagnetic navigation and biopsies.  Estimated Blood Loss: Minimal  Complications: None   Indications and History: Jared Stewart is a 50 y.o. male with multiple pulmonary nodules, subcarinal adenopathy.  The risks, benefits, complications, treatment options and expected outcomes were discussed with the patient.  The possibilities of pneumothorax, pneumonia, reaction to medication, pulmonary aspiration, perforation of a viscus, bleeding, failure to diagnose a condition and creating a complication requiring transfusion or operation were discussed with the patient who freely signed the consent.    Description of endobronchial navigation procedure: The patient was seen in the Preoperative Area, was examined and was deemed appropriate to proceed.  The patient was taken to [facility] endoscopy room 2, identified as Jared Stewart and the procedure verified as Flexible Video Fiberoptic Bronchoscopy.  A Time Out was held and the above information confirmed.   Prior to the date of the procedure a high-resolution CT scan of the chest was performed. Utilizing Rogers a virtual tracheobronchial tree was generated to allow the creation of distinct navigation pathways to the patient's parenchymal abnormalities. After being taken to the operating room general anesthesia was initiated and the patient  was orally intubated. The video fiberoptic bronchoscope was introduced via the endotracheal tube and a general inspection was performed which showed normal right and left lung anatomy with no evidence  of endobronchial lesion.   Target #1 right upper lobe: The extendable working channel and locator guide were introduced into the bronchoscope. The distinct navigation pathways prepared prior to this procedure were then utilized to navigate to within 0.6 cm of patient's lesion(s) identified on CT scan. The extendable working channel was secured into place and the locator guide was withdrawn. Under fluoroscopic guidance transbronchial needle brushings, transbronchial Wang needle biopsies, and transbronchial forceps biopsies were performed to be sent for cytology and pathology.  Additionally transbronchial forcep biopsies were collected and saline for tissue culture.  A bronchioalveolar lavage was performed in the right upper lobe and sent for cytology and microbiology (bacterial, fungal, AFB smears and cultures).   Target #2 right lower lobe: The extendable working channel and locator guide were introduced into the bronchoscope. The distinct navigation pathways prepared prior to this procedure were then utilized to navigate to within 0.8 cm of patient's lesion(s) identified on CT scan. The extendable working channel was secured into place and the locator guide was withdrawn. Under fluoroscopic guidance transbronchial needle brushings, transbronchial Wang needle biopsies, and transbronchial forceps biopsies were performed to be sent for cytology and pathology.  Additionally transbronchial forcep biopsies were collected for tissue culture in saline.  A bronchioalveolar lavage was performed in the right lower lobe and sent for cytology and microbiology (bacterial, fungal, AFB smears and cultures).   Target #3 left upper lobe, lingula: The extendable working channel and locator guide were introduced into the bronchoscope. The distinct navigation pathways prepared prior to this procedure were then utilized to navigate to within 0.8 cm of patient's lesion(s) identified on CT scan. The extendable working channel was  secured into place and the locator guide was withdrawn. Under fluoroscopic guidance transbronchial needle brushings, transbronchial Wang needle biopsies, and transbronchial forceps biopsies were performed to be sent for cytology and pathology.  Additionally transbronchial forced biopsies were collected for tissue culture in saline.  A bronchioalveolar lavage was performed in the left upper lobe, lingula and sent for cytology and microbiology (bacterial, fungal, AFB smears and cultures).   Description of endobronchial ultrasound procedure: The standard scope was then withdrawn and the endobronchial ultrasound was used to identify and characterize the peritracheal, hilar and bronchial lymph nodes. Inspection showed no significant hilar adenopathy identified, and large subcarinal node identified under ultrasound. Using real-time ultrasound guidance Wang needle biopsies were take from Station 7, subcarinal, nodes and were sent for cytology. The patient tolerated the procedure well without apparent complications.   Standard therapeutic bronchoscope was inserted into the airway.  Therapeutic aspiration of the bilateral mainstem's remove any blood clots and remaining debris or secretions.  All distal subsegments of the airways were patent at the termination of the procedure.  At the end of the procedure a general airway inspection was performed and there was no evidence of active bleeding. The bronchoscope was removed.  The patient tolerated the procedure well. There was no significant blood loss and there were no obvious complications. A post-procedural chest x-ray is pending.  Samples target #1 right upper lobe: 1. Transbronchial needle brushings from right upper lobe 2. Transbronchial Wang needle biopsies from right upper lobe 3. Transbronchial forceps biopsies from right upper lobe 4. Bronchoalveolar lavage from right upper lobe  Samples target #2 right lower lobe: 1. Transbronchial needle brushings from  right lower lobe 2. Transbronchial Wang needle biopsies from right lower lobe 3. Transbronchial forceps biopsies from right lower lobe 4. Bronchoalveolar lavage from right lower lobe  Samples target #3 left upper lobe:: 1. Transbronchial needle brushings from left upper lobe 2. Transbronchial Wang needle biopsies from left upper lobe 3. Transbronchial forceps biopsies from left upper lobe 4. Bronchoalveolar lavage from left upper lobe  Samples: 1. Wang needle biopsies from station 7 node  Plans:  The patient will be discharged from the PACU to home when recovered from anesthesia and after chest x-ray is reviewed. We will review the cytology, pathology and microbiology results with the patient when they become available. Outpatient followup will be with Dr. Lamonte Sakai, MD.    Garner Nash, DO [facility] 11/21/2020 12:24 PM

## 2020-11-21 NOTE — Telephone Encounter (Signed)
Patient called with wife on the phone stating that patient had acute onset chest pain at around 5 pm accompanied by hoarseness. He underwent bronchoscopy with needle biopsy and BAL earlier. Ive advised them to proceed to ER and be evaluated. He will need at the least a chest x ray to rule out pneumothorax.

## 2020-11-21 NOTE — Discharge Instructions (Signed)
Flexible Bronchoscopy  Flexible bronchoscopy is a procedure used to examine the passageways in the lungs. During the procedure, a thin, flexible tool with a camera (bronchoscope) is passed into the mouth or nose, down through the windpipe (trachea), and into the air tubes in the lungs (bronchi). This tool allows the health care provider to look inside the lungs and to take samples for testing, if needed. Tell a health care provider about:  Any allergies you have.  All medicines you are taking, including vitamins, herbs, eye drops, creams, and over-the-counter medicines.  Any problems you or family members have had with anesthetic medicines.  Any blood disorders you have.  Any surgeries you have had.  Any medical conditions you have.  Whether you are pregnant or may be pregnant. What are the risks? Generally, this is a safe procedure. However, problems may occur, including:  Infection.  Bleeding.  Damage to other structures or organs.  Allergic reactions to medicines.  Collapsed lung (pneumothorax).  Increased need for oxygen or difficulty breathing after the procedure. What happens before the procedure? Staying hydrated Follow instructions from your health care provider about hydration, which may include:  Up to 2 hours before the procedure - you may continue to drink clear liquids, such as water, clear fruit juice, black coffee, and plain tea.   Eating and drinking restrictions Follow instructions from your health care provider about eating and drinking, which may include:  8 hours before the procedure - stop eating heavy meals or foods, such as meat, fried foods, or fatty foods.  6 hours before the procedure - stop eating light meals or foods, such as toast or cereal.  6 hours before the procedure - stop drinking milk or drinks that contain milk.  2 hours before the procedure - stop drinking clear liquids. Medicines Ask your health care provider about:  Changing or  stopping your regular medicines. This is especially important if you are taking diabetes medicines or blood thinners.  Taking medicines such as aspirin and ibuprofen. These medicines can thin your blood. Do not take these medicines unless your health care provider tells you to take them.  Taking over-the-counter medicines, vitamins, herbs, and supplements. General instructions  You may be given antibiotic medicine to help lower the risk of infection.  Plan to have a responsible adult take you home from the hospital or clinic.  If you will be going home right after the procedure, plan to have a responsible adult care for you for the time you are told. This is important. What happens during the procedure?  An IV will be inserted into one of your veins.  You will be given a medicine (local anesthetic) to numb your mouth, nose, throat, and voice box (larynx). You may also be given one or more of the following: ? A medicine to help you relax (sedative). ? A medicine to control coughing. ? A medicine to dry up any fluids or secretions in your lungs.  A bronchoscope will be passed into your nose or mouth, and into your lungs. Your health care provider will examine your lungs.  Samples of airway secretions may be collected for testing.  If abnormal areas are seen in your airways, samples of tissue may be removed and checked under a microscope (biopsy).  If tissue samples are needed from the outer parts of the lung, a type of X-ray (fluoroscopy) may be used to guide the bronchoscope to these areas.  If bleeding occurs, you may be given medicine to  stop or decrease the bleeding. The procedure may vary among health care providers and hospitals. What can I expect after the procedure?  Your blood pressure, heart rate, breathing rate, and blood oxygen level will be monitored until you leave the hospital or clinic.  You may have a chest X-ray to check for signs of pneumothorax.  You willnot be  allowed to eat or drink anything for 2 hours after your procedure.  If a biopsy was taken, it is up to you to get the results of the test. Ask your health care provider, or the department that is doing the procedure, when your results will be ready.  You may have the following symptoms for 24-48 hours: ? A cough that is worse than it was before the procedure. ? A low-grade fever. ? A sore throat or hoarse voice. ? Some blood in the mucus from your lungs (sputum), if a biopsy was done. Follow these instructions at home: Eating and drinking  Do not eat or drink anything, including water, for 2 hours after your procedure, or until your numbing medicine has worn off. Having a numb throat increases your risk of burning yourself or choking.  Start eating soft foods and slowly drinking liquids after your numbness is gone and your cough and gag reflexes have returned.  You may return to your normal diet the day after the procedure. Driving  If you were given a sedative during the procedure, it can affect you for several hours. Do not drive or operate machinery until your health care provider says that it is safe.  Ask your health care provider if the medicine prescribed to you requires you to avoid driving or using machinery.  Return to your normal activities as told by your health care provider. Ask your health care provider what activities are safe for you. General instructions  Take over-the-counter and prescription medicines only as told by your health care provider.  Do not use any products that contain nicotine or tobacco. These products include cigarettes, chewing tobacco, and vaping devices, such as e-cigarettes. If you need help quitting, ask your health care provider.  Keep all follow-up visits. This is important.   Get help right away if:  You have shortness of breath that gets worse.  You become light-headed or feel like you might faint.  You have chest pain.  You cough up  more than a small amount of blood. These symptoms may represent a serious problem that is an emergency. Do not wait to see if the symptoms will go away. Get medical help right away. Call your local emergency services (911 in the [place] Do not drive yourself to the hospital. Summary  Flexible bronchoscopy is a procedure that allows your health care provider to look closely inside your lungs and to take testing samples if needed.  Risks of flexible bronchoscopy include bleeding, infection, and collapsed lung (pneumothorax).  Before the procedure, you will be given a medicine to numb your mouth, nose, throat, and voice box. Then, a bronchoscope will be passed into your nose or mouth, and into your lungs.  After the procedure, your blood pressure, heart rate, breathing rate, and blood oxygen level will be monitored until you leave the hospital or clinic. You may have a chest X-ray to check for signs of pneumothorax.  You will not be allowed to eat or drink anything for 2 hours after your procedure. This information is not intended to replace advice given to you by your health care provider.  Make sure you discuss any questions you have with your health care provider. Document Revised: 05/04/2020 Document Reviewed: 05/04/2020 Elsevier Patient Education  [place]. Flexible Bronchoscopy, Care After This sheet gives you information about how to care for yourself after your test. Your doctor may also give you more specific instructions. If you have problems or questions, contact your doctor. Follow these instructions at home: Eating and drinking  Do not eat or drink anything (not even water) for 2 hours after your test, or until your numbing medicine (local anesthetic) wears off.  When your numbness is gone and your cough and gag reflexes have come back, you may: ? Eat only soft foods. ? Slowly drink liquids.  The day after the test, go back to your normal diet. Driving  Do not drive  for 24 hours if you were given a medicine to help you relax (sedative).  Do not drive or use heavy machinery while taking prescription pain medicine. General instructions   Take over-the-counter and prescription medicines only as told by your doctor.  Return to your normal activities as told. Ask what activities are safe for you.  Do not use any products that have nicotine or tobacco in them. This includes cigarettes and e-cigarettes. If you need help quitting, ask your doctor.  Keep all follow-up visits as told by your doctor. This is important. It is very important if you had a tissue sample (biopsy) taken. Get help right away if:  You have shortness of breath that gets worse.  You get light-headed.  You feel like you are going to pass out (faint).  You have chest pain.  You cough up: ? More than a little blood. ? More blood than before. Summary  Do not eat or drink anything (not even water) for 2 hours after your test, or until your numbing medicine wears off.  Do not use cigarettes. Do not use e-cigarettes.  Get help right away if you have chest pain.   This information is not intended to replace advice given to you by your health care provider. Make sure you discuss any questions you have with your health care provider. Document Released: 08/11/2009 Document Revised: 09/26/2017 Document Reviewed: 11/01/2016 Elsevier Patient Education  2020 [employer].

## 2020-11-21 NOTE — Interval H&P Note (Signed)
History and Physical Interval Note:  11/21/2020 10:18 AM  Jared Stewart  has presented today for surgery, with the diagnosis of BILATERAL LUNG NODULE.  The various methods of treatment have been discussed with the patient and family. After consideration of risks, benefits and other options for treatment, the patient has consented to  Procedure(s): VIDEO BRONCHOSCOPY WITH ENDOBRONCHIAL ULTRASOUND (N/A) VIDEO BRONCHOSCOPY WITH ENDOBRONCHIAL NAVIGATION (N/A) as a surgical intervention.  The patient's history has been reviewed, patient examined, no change in status, stable for surgery.  I have reviewed the patient's chart and labs.  Questions were answered to the patient's satisfaction.     [name]

## 2020-11-21 NOTE — Anesthesia Preprocedure Evaluation (Addendum)
Anesthesia Evaluation  Patient identified by MRN, date of birth, ID band Patient awake    Reviewed: Allergy & Precautions, NPO status , Patient's Chart, lab work & pertinent test results  History of Anesthesia Complications Negative for: history of anesthetic complications  Airway Mallampati: II  TM Distance: >3 FB Neck ROM: Full    Dental  (+) Dental Advisory Given, Missing   Pulmonary sleep apnea (oral device, no CPAP) ,    Pulmonary exam normal        Cardiovascular negative cardio ROS Normal cardiovascular exam     Neuro/Psych negative neurological ROS  negative psych ROS   GI/Hepatic negative GI ROS, (+)     substance abuse  marijuana use,   Endo/Other  negative endocrine ROS  Renal/GU negative Renal ROS     Musculoskeletal negative musculoskeletal ROS (+)   Abdominal   Peds  Hematology negative hematology ROS (+)   Anesthesia Other Findings Covid test negative Psoriasis   Reproductive/Obstetrics                            Anesthesia Physical Anesthesia Plan  ASA: II  Anesthesia Plan: General   Post-op Pain Management:    Induction: Intravenous  PONV Risk Score and Plan: 2 and Treatment may vary due to age or medical condition, Ondansetron, Dexamethasone and Midazolam  Airway Management Planned: Oral ETT  Additional Equipment: None  Intra-op Plan:   Post-operative Plan: Extubation in OR  Informed Consent: I have reviewed the patients History and Physical, chart, labs and discussed the procedure including the risks, benefits and alternatives for the proposed anesthesia with the patient or authorized representative who has indicated his/her understanding and acceptance.     Dental advisory given  Plan Discussed with: CRNA and Anesthesiologist  Anesthesia Plan Comments:        Anesthesia Quick Evaluation

## 2020-11-21 NOTE — Transfer of Care (Signed)
Immediate Anesthesia Transfer of Care Note  Patient: Jared Stewart  Procedure(s) Performed: VIDEO BRONCHOSCOPY WITH ENDOBRONCHIAL ULTRASOUND (N/A ) VIDEO BRONCHOSCOPY WITH ENDOBRONCHIAL NAVIGATION (N/A ) BRONCHIAL BIOPSIES BRONCHIAL BRUSHINGS BRONCHIAL NEEDLE ASPIRATION BIOPSIES BRONCHIAL WASHINGS  Patient Location: PACU - endo suite  Anesthesia Type:General  Level of Consciousness: awake, alert  and oriented  Airway & Oxygen Therapy: Patient Spontanous Breathing and Patient connected to nasal cannula oxygen  Post-op Assessment: Report given to RN and Post -op Vital signs reviewed and stable  Post vital signs: Reviewed and stable  Last Vitals:  Vitals Value Taken Time  BP    Temp    Pulse    Resp    SpO2      Last Pain:  Vitals:   11/21/20 0757  TempSrc:   PainSc: 0-No pain         Complications: No complications documented.

## 2020-11-21 NOTE — Anesthesia Procedure Notes (Signed)
Procedure Name: Intubation Date/Time: 11/21/2020 10:38 AM Performed by: Moshe Salisbury, CRNA Pre-anesthesia Checklist: Patient identified, Emergency Drugs available, Suction available and Patient being monitored Patient Re-evaluated:Patient Re-evaluated prior to induction Oxygen Delivery Method: Circle System Utilized Preoxygenation: Pre-oxygenation with 100% oxygen Induction Type: IV induction Ventilation: Mask ventilation without difficulty Laryngoscope Size: Mac and 4 Grade View: Grade I Tube type: Oral Tube size: 9.0 mm Number of attempts: 1 Airway Equipment and Method: Stylet Placement Confirmation: ETT inserted through vocal cords under direct vision,  positive ETCO2 and breath sounds checked- equal and bilateral Secured at: 23 cm Tube secured with: Tape Dental Injury: Teeth and Oropharynx as per pre-operative assessment

## 2020-11-21 NOTE — Anesthesia Postprocedure Evaluation (Signed)
Anesthesia Post Note  Patient: Jared Stewart  Procedure(s) Performed: VIDEO BRONCHOSCOPY WITH ENDOBRONCHIAL ULTRASOUND (N/A ) VIDEO BRONCHOSCOPY WITH ENDOBRONCHIAL NAVIGATION (N/A ) BRONCHIAL BIOPSIES BRONCHIAL BRUSHINGS BRONCHIAL NEEDLE ASPIRATION BIOPSIES BRONCHIAL WASHINGS     Patient location during evaluation: PACU Anesthesia Type: General Level of consciousness: awake and alert Pain management: pain level controlled Vital Signs Assessment: post-procedure vital signs reviewed and stable Respiratory status: spontaneous breathing, nonlabored ventilation and respiratory function stable Cardiovascular status: blood pressure returned to baseline and stable Postop Assessment: no apparent nausea or vomiting Anesthetic complications: no   No complications documented.  Last Vitals:  Vitals:   11/21/20 1240 11/21/20 1250  BP: 110/67 (!) 118/53  Pulse: 86 85  Resp: 13 13  Temp: 36.7 C   SpO2: 95% 98%    Last Pain:  Vitals:   11/21/20 1250  TempSrc:   PainSc: 0-No pain                 Beryle Lathe

## 2020-11-22 LAB — ACID FAST SMEAR (AFB, MYCOBACTERIA)
Acid Fast Smear: NEGATIVE
Acid Fast Smear: NEGATIVE
Acid Fast Smear: NEGATIVE
Acid Fast Smear: NEGATIVE
Acid Fast Smear: NEGATIVE
Acid Fast Smear: NEGATIVE

## 2020-11-22 LAB — CYTOLOGY - NON PAP

## 2020-11-22 LAB — CULTURE, BAL-QUANTITATIVE W GRAM STAIN
Culture: 20000 — AB
Culture: 60000 — AB
Gram Stain: NONE SEEN
Gram Stain: NONE SEEN

## 2020-11-23 ENCOUNTER — Encounter (HOSPITAL_COMMUNITY): Payer: Self-pay | Admitting: Pulmonary Disease

## 2020-11-23 ENCOUNTER — Other Ambulatory Visit: Payer: Self-pay

## 2020-11-23 LAB — CYTOLOGY - NON PAP

## 2020-11-23 LAB — FUNGUS CULTURE WITH STAIN

## 2020-11-23 NOTE — Telephone Encounter (Signed)
Jared Stewart said the rx was written by dr Hyman Hopes

## 2020-11-24 ENCOUNTER — Telehealth: Payer: Self-pay | Admitting: Pulmonary Disease

## 2020-11-24 LAB — CULTURE, BAL-QUANTITATIVE W GRAM STAIN
Culture: NO GROWTH
Gram Stain: NONE SEEN

## 2020-11-24 LAB — FUNGUS CULTURE WITH STAIN

## 2020-11-24 NOTE — Telephone Encounter (Signed)
Spoke to [employer]. Advised to reach out to Dr. Sharyn Lull office (previous note from [place]) to gain pertinent information.

## 2020-11-24 NOTE — Telephone Encounter (Signed)
I write for Cosentyx under Dr. Hyman Hopes for specialty pharmacy. Dr. Sharyn Lull at [facility] manages his psoriasis. Please direct this to their office.

## 2020-11-26 LAB — AEROBIC/ANAEROBIC CULTURE W GRAM STAIN (SURGICAL/DEEP WOUND)
Culture: NO GROWTH
Culture: NO GROWTH
Culture: NO GROWTH
Gram Stain: NONE SEEN
Gram Stain: NONE SEEN

## 2020-11-27 ENCOUNTER — Telehealth: Payer: Self-pay | Admitting: Emergency Medicine

## 2020-11-27 NOTE — Telephone Encounter (Signed)
Dorisann Frames RN Called and spoke with pt. Pt states he can't schedule today without looking at calender. Encouraged pt to call back quickly to set up OV as Rb's schedule for Feb. is filling up fast.

## 2020-11-27 NOTE — Telephone Encounter (Signed)
Good morning Dr. Tonia Brooms, The patient is requesting the results of his bronch from 11/21/20, please advise.  Thank you.

## 2020-11-27 NOTE — Telephone Encounter (Signed)
Thank you Dr. Tonia Brooms, I will let the patient know and have him scheduled to see Dr. Delton Coombes.  What time frame do you want him to follow up?

## 2020-11-27 NOTE — Telephone Encounter (Signed)
Spoke with pt to try and scheduled OV with Dr. Delton Coombes. If pt calls back 2:30pm appointment is held on 12/04/2020 for pt. Please schedule there. Nothing further needed at this time.

## 2020-11-27 NOTE — Telephone Encounter (Signed)
Please refer to mychart message from 1/28 as it is a duplicate encounter to this phone encounter.

## 2020-11-30 ENCOUNTER — Telehealth: Payer: Self-pay | Admitting: Emergency Medicine

## 2020-11-30 NOTE — Telephone Encounter (Signed)
Noted will route to [facility] as [employer]

## 2020-12-02 MED FILL — PEG-3350 SOLUTION: 420 | 1 days supply | Qty: 4000 | Fill #0

## 2020-12-05 ENCOUNTER — Other Ambulatory Visit: Payer: Self-pay | Admitting: Pharmacist

## 2020-12-05 MED ORDER — COSENTYX SENSOREADY PEN 150 MG/ML ~~LOC~~ SOAJ: SUBCUTANEOUS | 3 refills | Status: DC

## 2020-12-06 ENCOUNTER — Other Ambulatory Visit: Payer: Self-pay | Admitting: Pharmacist

## 2020-12-06 MED ORDER — COSENTYX SENSOREADY PEN 150 MG/ML ~~LOC~~ SOAJ: SUBCUTANEOUS | 2 refills | Status: DC

## 2020-12-11 MED FILL — COSENTYX 300 MG DOSE-2 PENS: 150 | 28 days supply | Qty: 2 | Fill #0

## 2020-12-13 ENCOUNTER — Encounter: Payer: Self-pay | Admitting: Emergency Medicine

## 2020-12-13 ENCOUNTER — Ambulatory Visit (INDEPENDENT_AMBULATORY_CARE_PROVIDER_SITE_OTHER): Payer: No Typology Code available for payment source | Admitting: Emergency Medicine

## 2020-12-13 ENCOUNTER — Other Ambulatory Visit: Payer: Self-pay

## 2020-12-13 DIAGNOSIS — R918 Other nonspecific abnormal finding of lung field: Secondary | ICD-10-CM | POA: Diagnosis not present

## 2020-12-13 DIAGNOSIS — D869 Sarcoidosis, unspecified: Secondary | ICD-10-CM | POA: Diagnosis not present

## 2020-12-13 NOTE — Progress Notes (Signed)
Subjective:    Patient ID: Jared Stewart, male    DOB: [birth year], 50 y.o.   MRN: [id number]  HPI 50 year old never smoker with a history of psoriasis, eczema, mild OSA.  He is on Cosentyx, has been on for 1-1.5 yrs.  He is referred today for evaluation of abnormal CT scan of the chest. He was under eval to provide a kidney transplant for his brother.  Upon evaluation included a CT chest as below.  There is suspicion for possible granulomatous disease and this needs to be worked up before the renal donation could proceed  Report from CT scan of the chest done on 08/17/2020 at [facility] available for review.  Showed bandlike bilateral upper lobe and right lower lobe opacities, bilateral upper lobe predominant perilymphatic micro nodularity, scattered sub-4 mm solid pulmonary nodules.  There were no overt consolidations or infiltrates.  There was some inferior left major fissural pleural thickening.  Consider possibly sarcoidosis, pneumoconiosis or some granulomatous process.  He feels well. He doesn't have any breathing difficulty, does not cough. No rash other than his psoriasis. No other sequela of possible sarcoidosis. He has a family history of sarcoidosis in his brother.   Quant GOLD 08/18/20 >> negative  ROV 11/03/20 --follow-up visit Jared Stewart with psoriasis and eczema on Cosentyx, OSA.  He had an incidental finding of bilateral rounded opacities on CT chest as he was being evaluated for possible kidney transplantation donation for his brother.  CT chest from [facility] as above. Repeat CT chest 10/25/2020 reviewed by me, shows persistent bandlike opacities with some wedge-shaped consolidative change into the right upper lobe with some hilar distortion and volume loss, associated micro nodularity, 4 x 2.3 cm in greatest dimension.  Also a bandlike area in the left upper lobe 3 x 1.9 cm.  Subcarinal nodal enlargement 1.5 cm.   ROV 12/13/20 --Jared Stewart is Jared Stewart with a history of psoriasis and  eczema on Cosentyx.  Also with OSA.  We have evaluated him for rounded opacities in the chest that were found on CT, persistent on scan 10/25/2020 with some bandlike opacities and wedge-shaped consolidation in the right upper lobe, micro nodularity in a bandlike left upper lobe region with some associated subcarinal lymphadenopathy.  Dr. Tonia Brooms performed bronchoscopy on 1/25.  Samples were obtained in the right upper lobe, right lower lobe, left upper lobe, station 7 node.  No malignant cells identified at any target, vague granulomatous inflammation with associated foreign body type giant cells (AFB and fungal stains negative).  There was granulomatous inflammation in the station 7 lymph node. All culture data is negative so far as are smears.     Review of Systems As per HPI      Objective:   Physical Exam Vitals:   12/13/20 1035  BP: (!) 142/82  Pulse: 81  Temp: 97.6 F (36.4 C)  TempSrc: Temporal  SpO2: 98%  Weight: 205 lb 6.4 oz (93.2 kg)  Height: 6' (1.829 m)   Gen: Pleasant, well-nourished, in no distress,  normal affect  ENT: No lesions,  mouth clear,  oropharynx clear, no postnasal drip  Neck: No JVD, no stridor  Lungs: No use of accessory muscles, no crackles or wheezing on normal respiration, no wheeze on forced expiration  Cardiovascular: RRR, heart sounds normal, no murmur or gallops, no peripheral edema  Musculoskeletal: His right knee is in an immobilizer post surgery  Neuro: alert, awake, non focal  Skin: Warm, no lesions or rash     Assessment &  Plan:  Pulmonary nodules/lesions, multiple Bronchoscopy with granulomatous inflammation and no evidence so far of infection.  Final AFB cultures are pending.  Smears negative.  Bacterial culture did show 60 K CFU H. influenzae of unknown significance but this would not contribute to granulomatous disease.  Question whether this is sarcoidosis versus pulmonary manifestation of his psoriasis.  This is not in a  pneumonitis pattern which would be the most likely side effect of Cosentyx if it were to occur.  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  I will send a hypersensitivity pneumonitis panel. Repeat CT chest as below.  We will plan to perform pulmonary function testing when we can get this scheduled for you, sometime before next visit Lab work today will be a hypersensitivity pneumonitis panel to evaluate for any kind of exposures that would cause granulomatous inflammation in the lungs. We will repeat your CT scan of the chest without contrast in late June 2022 We will follow your culture information to completion Please establish with your ophthalmologist that we suspected diagnosis of sarcoidosis so that you can get appropriate follow-up Follow with Dr. Delton Coombes in June after your CT so that we can review the results together.  Levy Pupa, MD, PhD 12/13/2020, 2:17 PM [facility] [phone] or if no answer [phone]

## 2020-12-13 NOTE — Assessment & Plan Note (Signed)
Bronchoscopy with granulomatous inflammation and no evidence so far of infection.  Final AFB cultures are pending.  Smears negative.  Bacterial culture did show 60 K CFU H. influenzae of unknown significance but this would not contribute to granulomatous disease.  Question whether this is sarcoidosis versus pulmonary manifestation of his psoriasis.  This is not in a pneumonitis pattern which would be the most likely side effect of Cosentyx if it were to occur.  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  I will send a hypersensitivity pneumonitis panel. Repeat CT chest as below.  We will plan to perform pulmonary function testing when we can get this scheduled for you, sometime before next visit Lab work today will be a hypersensitivity pneumonitis panel to evaluate for any kind of exposures that would cause granulomatous inflammation in the lungs. We will repeat your CT scan of the chest without contrast in late June 2022 We will follow your culture information to completion Please establish with your ophthalmologist that we suspected diagnosis of sarcoidosis so that you can get appropriate follow-up Follow with Dr. Delton Coombes in June after your CT so that we can review the results together.

## 2020-12-13 NOTE — Telephone Encounter (Signed)
Please advise on patient mychart message  Hi Dr. Delton Coombes,  [facility] you're well, and it was nice to see you today. My wife Shanda Bumps has one additional question about our conversation from today's visit that she didn't think about until after we talked together. She is curious if we should do a round of antibiotics to treat the HIB colonies in my lungs.  She is under the impression that HIB is typically found more in the upper respiratory tract versus the lower and she is concerned it could be a potential source of some of the granulation or inflammation in my lungs, so she wanted me to ask  you about it.  Thanks much, and have a good rest of your day.  Best regards,  Jared Stewart

## 2020-12-13 NOTE — Patient Instructions (Signed)
We will plan to perform pulmonary function testing when we can get this scheduled for you, sometime before next visit Lab work today will be a hypersensitivity pneumonitis panel to evaluate for any kind of exposures that would cause granulomatous inflammation in the lungs. We will repeat your CT scan of the chest without contrast in late June 2022 We will follow your culture information to completion Please establish with your ophthalmologist that we suspected diagnosis of sarcoidosis so that you can get appropriate follow-up Follow with Dr. Delton Coombes in June after your CT so that we can review the results together.

## 2020-12-14 NOTE — Telephone Encounter (Signed)
His only positive culture was low titer Haemophilus influenza in 1 out of 3 washings.  I suspect that this was an upper respiratory contaminant which is why I did not recommend treating.  H. influenzae does not cause granulomas.  If we wanted to treat for this bacteria for completeness sake then we could do so but again I suspect it came from his upper respiratory tract (which the bronchoscope had to pass through).  If he would like to treat then would start cefuroxime 250 mg twice a day for 7 days.

## 2020-12-18 LAB — HYPERSENSITIVITY PNEUMONITIS
A. Pullulans Abs: NEGATIVE
A.Fumigatus #1 Abs: NEGATIVE
Micropolyspora faeni, IgG: NEGATIVE
Pigeon Serum Abs: NEGATIVE
Thermoact. Saccharii: NEGATIVE
Thermoactinomyces vulgaris, IgG: NEGATIVE

## 2020-12-22 LAB — FUNGUS CULTURE WITH STAIN

## 2020-12-22 LAB — FUNGUS CULTURE RESULT

## 2020-12-22 LAB — FUNGAL ORGANISM REFLEX

## 2020-12-25 ENCOUNTER — Other Ambulatory Visit (HOSPITAL_COMMUNITY): Payer: Self-pay | Admitting: Family Medicine

## 2020-12-25 MED FILL — VALACYCLOVIR HCL 500 MG TAB: 500 | 90 days supply | Qty: 90 | Fill #0

## 2020-12-30 ENCOUNTER — Other Ambulatory Visit (HOSPITAL_COMMUNITY)
Admission: RE | Admit: 2020-12-30 | Discharge: 2020-12-30 | Disposition: A | Discharge status: Home / Self Care | Payer: No Typology Code available for payment source | Source: Ambulatory Visit | Attending: Emergency Medicine | Admitting: Emergency Medicine

## 2020-12-30 DIAGNOSIS — Z01812 Encounter for preprocedural laboratory examination: Secondary | ICD-10-CM | POA: Insufficient documentation

## 2020-12-30 DIAGNOSIS — Z20822 Contact with and (suspected) exposure to covid-19: Secondary | ICD-10-CM | POA: Insufficient documentation

## 2020-12-30 LAB — SARS CORONAVIRUS 2 (TAT 6-24 HRS): SARS Coronavirus 2: NEGATIVE

## 2021-01-03 ENCOUNTER — Other Ambulatory Visit: Payer: Self-pay

## 2021-01-03 ENCOUNTER — Ambulatory Visit (INDEPENDENT_AMBULATORY_CARE_PROVIDER_SITE_OTHER): Payer: No Typology Code available for payment source | Admitting: Emergency Medicine

## 2021-01-03 DIAGNOSIS — D869 Sarcoidosis, unspecified: Secondary | ICD-10-CM

## 2021-01-03 LAB — PULMONARY FUNCTION TEST
DL/VA % pred: 96 %
DL/VA: 4.27 ml/min/mmHg/L
DLCO cor % pred: 88 %
DLCO cor: 26.85 ml/min/mmHg
DLCO unc % pred: 88 %
DLCO unc: 26.85 ml/min/mmHg
FEF 25-75 Post: 3.28 L/sec
FEF 25-75 Pre: 2.79 L/sec
FEF2575-%Change-Post: 17 %
FEF2575-%Pred-Post: 94 %
FEF2575-%Pred-Pre: 80 %
FEV1-%Change-Post: 5 %
FEV1-%Pred-Post: 108 %
FEV1-%Pred-Pre: 103 %
FEV1-Post: 3.8 L
FEV1-Pre: 3.62 L
FEV1FVC-%Change-Post: 7 %
FEV1FVC-%Pred-Pre: 90 %
FEV6-%Change-Post: 0 %
FEV6-%Pred-Post: 113 %
FEV6-%Pred-Pre: 114 %
FEV6-Post: 4.84 L
FEV6-Pre: 4.86 L
FEV6FVC-%Pred-Post: 102 %
FEV6FVC-%Pred-Pre: 102 %
FVC-%Change-Post: -2 %
FVC-%Pred-Post: 110 %
FVC-%Pred-Pre: 113 %
FVC-Post: 4.84 L
FVC-Pre: 4.96 L
Post FEV1/FVC ratio: 78 %
Post FEV6/FVC ratio: 100 %
Pre FEV1/FVC ratio: 73 %
Pre FEV6/FVC Ratio: 100 %
RV % pred: 62 %
RV: 1.3 L
TLC % pred: 86 %
TLC: 6.17 L

## 2021-01-03 NOTE — Progress Notes (Signed)
Full PFT performed today. °

## 2021-01-03 NOTE — Patient Instructions (Signed)
Pulmonary function test performed today. 

## 2021-01-04 LAB — ACID FAST CULTURE WITH REFLEXED SENSITIVITIES (MYCOBACTERIA)
Acid Fast Culture: NEGATIVE
Acid Fast Culture: NEGATIVE
Acid Fast Culture: NEGATIVE
Acid Fast Culture: NEGATIVE
Acid Fast Culture: NEGATIVE
Acid Fast Culture: NEGATIVE

## 2021-01-08 ENCOUNTER — Other Ambulatory Visit: Payer: Self-pay | Admitting: Emergency Medicine

## 2021-01-08 MED ORDER — CEFUROXIME AXETIL 250 MG PO TABS: 250.0000 mg | ORAL_TABLET | Freq: Two times a day (BID) | ORAL | 0 refills | Status: DC

## 2021-01-08 NOTE — Telephone Encounter (Signed)
Cefuroxime 250mg  bid x 7 days. Take until completely gone

## 2021-01-26 ENCOUNTER — Other Ambulatory Visit (HOSPITAL_COMMUNITY): Payer: Self-pay

## 2021-02-01 ENCOUNTER — Other Ambulatory Visit (HOSPITAL_COMMUNITY): Payer: Self-pay

## 2021-02-01 MED FILL — Secukinumab Subcutaneous Auto-inj 150 MG/ML (300 MG Dose): SUBCUTANEOUS | 28 days supply | Qty: 2 | Fill #0 | Status: AC

## 2021-02-05 ENCOUNTER — Other Ambulatory Visit (HOSPITAL_COMMUNITY): Payer: Self-pay

## 2021-02-28 ENCOUNTER — Other Ambulatory Visit (HOSPITAL_COMMUNITY): Payer: Self-pay

## 2021-03-02 ENCOUNTER — Other Ambulatory Visit (HOSPITAL_COMMUNITY): Payer: Self-pay

## 2021-03-05 ENCOUNTER — Other Ambulatory Visit (HOSPITAL_COMMUNITY): Payer: Self-pay

## 2021-03-08 ENCOUNTER — Other Ambulatory Visit (HOSPITAL_COMMUNITY): Payer: Self-pay

## 2021-03-08 MED FILL — Secukinumab Subcutaneous Auto-inj 150 MG/ML (300 MG Dose): SUBCUTANEOUS | 28 days supply | Qty: 2 | Fill #1 | Status: AC

## 2021-03-09 ENCOUNTER — Other Ambulatory Visit (HOSPITAL_COMMUNITY): Payer: Self-pay

## 2021-03-09 MED ORDER — CARESTART COVID-19 HOME TEST VI KIT
PACK | 0 refills | Status: AC
  Filled 2021-03-09: qty 4, 4d supply, fill #0

## 2021-03-28 ENCOUNTER — Other Ambulatory Visit (HOSPITAL_COMMUNITY): Payer: Self-pay

## 2021-03-28 MED ORDER — PAXLOVID 20 X 150 MG & 10 X 100MG PO TBPK
ORAL_TABLET | ORAL | 0 refills | Status: DC
  Filled 2021-03-28: qty 30, 5d supply, fill #0

## 2021-03-28 MED ORDER — PAXLOVID 20 X 150 MG & 10 X 100MG PO TBPK
ORAL_TABLET | ORAL | 0 refills | Status: AC
  Filled 2021-03-28: qty 30, 5d supply, fill #0

## 2021-03-29 ENCOUNTER — Other Ambulatory Visit (HOSPITAL_COMMUNITY): Payer: Self-pay

## 2021-03-29 MED FILL — Secukinumab Subcutaneous Auto-inj 150 MG/ML (300 MG Dose): SUBCUTANEOUS | 28 days supply | Qty: 2 | Fill #0 | Status: AC

## 2021-04-02 ENCOUNTER — Other Ambulatory Visit (HOSPITAL_COMMUNITY): Payer: Self-pay

## 2021-04-16 ENCOUNTER — Other Ambulatory Visit: Payer: Self-pay

## 2021-04-16 ENCOUNTER — Ambulatory Visit (INDEPENDENT_AMBULATORY_CARE_PROVIDER_SITE_OTHER)
Admission: RE | Admit: 2021-04-16 | Discharge: 2021-04-16 | Disposition: A | Discharge status: Home / Self Care | Payer: No Typology Code available for payment source | Source: Ambulatory Visit | Attending: Emergency Medicine | Admitting: Emergency Medicine

## 2021-04-16 DIAGNOSIS — D869 Sarcoidosis, unspecified: Secondary | ICD-10-CM

## 2021-04-24 ENCOUNTER — Other Ambulatory Visit (HOSPITAL_COMMUNITY): Payer: Self-pay

## 2021-05-01 ENCOUNTER — Other Ambulatory Visit (HOSPITAL_COMMUNITY): Payer: Self-pay

## 2021-05-01 MED FILL — Secukinumab Subcutaneous Auto-inj 150 MG/ML (300 MG Dose): SUBCUTANEOUS | 28 days supply | Qty: 2 | Fill #1 | Status: AC

## 2021-05-18 NOTE — Telephone Encounter (Signed)
Hello Dr. Delton Coombes, please advise on mychart message for CT done on 04/16/21 and PFT done on 01/03/21 results. Thanks!     Hi Dr. Delton Coombes,   I hope you're well. I just now reviewed the pulmonary function test and the most recent CT scan.   Can you please interpret them for me and let me know what further follow up I need to do, if any?   Thank you,   Jared Stewart

## 2021-05-21 ENCOUNTER — Ambulatory Visit: Payer: No Typology Code available for payment source | Attending: Family Medicine | Admitting: Pharmacist

## 2021-05-21 ENCOUNTER — Other Ambulatory Visit: Payer: Self-pay

## 2021-05-21 DIAGNOSIS — Z79899 Other long term (current) drug therapy: Secondary | ICD-10-CM

## 2021-05-21 NOTE — Telephone Encounter (Signed)
Please let the patient know this pulmonary function testing are overall normal with the exception of a slight restriction on the size of each breath at the end of expiration (modified). His CT scan of the chest shows stable inflammatory changes compared with his prior scan and consistent with possible sarcoidosis or some associated scar formation.  I do not think we need to start any new medications right now but I do want him to follow-up with me in the office so that we can talk about symptoms and plan appropriate timing for surveillance.

## 2021-05-21 NOTE — Progress Notes (Signed)
   S: Patient presents today for review of their specialty medication.    Patient is currently taking Cosentyx (secukinumab) for psoriasis. Patient is managed by Dr. Sharyn Lull for this.   Dosing: Plaque psoriasis: SubQ: 300 mg every 4 weeks.  Adherence: confirms  Efficacy: this medication is working well for him  Current adverse effects: S/sx of infection: denies GI upset: denies  Headache: denies  S/sx of hypersensitivity: none  O:  Lab Results  Component Value Date   WBC 6.4 11/03/2020   HGB 14.8 11/03/2020   HCT 45.2 11/03/2020   MCV 83.7 11/03/2020   PLT 272.0 11/03/2020      Chemistry      Component Value Date/Time   NA 136 11/03/2020 1024   K 4.4 11/03/2020 1024   CL 102 11/03/2020 1024   CO2 30 11/03/2020 1024   BUN 13 11/03/2020 1024   CREATININE 1.05 11/03/2020 1024      Component Value Date/Time   CALCIUM 9.8 11/03/2020 1024   ALKPHOS 56 11/03/2020 1024   AST 17 11/03/2020 1024   ALT 14 11/03/2020 1024   BILITOT 0.7 11/03/2020 1024       A/P: 1. Medication review: Patient is currently on Cosentyx for psoriasis and is tolerating it well. Reviewed the medication with the patient, including the following: Cosentyx is a monoclonal antibody used in the treatment of ankylosing spondylitis, psoriasis, and psoriatic arthritis. The injection is subq and the medication should be allowed to reach room temp prior to injecting. Injection sites should be rotated. Possible adverse effects include headaches, GI upset, increased risk of infection and hypersensitivity reactions. Stressed importance to good infection prevention technique during COVID19 pandemic. No recommendations for any changes at this time.  Butch Penny, PharmD, P[place]y [place], CPP Clinical Pharmacist [facility] [phone]

## 2021-05-22 ENCOUNTER — Other Ambulatory Visit (HOSPITAL_COMMUNITY): Payer: Self-pay

## 2021-05-22 MED FILL — Secukinumab Subcutaneous Auto-inj 150 MG/ML (300 MG Dose): SUBCUTANEOUS | 28 days supply | Qty: 2 | Fill #2 | Status: AC

## 2021-05-23 ENCOUNTER — Other Ambulatory Visit (HOSPITAL_COMMUNITY): Payer: Self-pay

## 2021-06-19 ENCOUNTER — Other Ambulatory Visit (HOSPITAL_COMMUNITY): Payer: Self-pay

## 2021-06-19 ENCOUNTER — Other Ambulatory Visit: Payer: Self-pay | Admitting: Pharmacist

## 2021-06-19 MED ORDER — COSENTYX SENSOREADY (300 MG) 150 MG/ML ~~LOC~~ SOAJ
SUBCUTANEOUS | 5 refills | Status: AC
  Filled 2021-06-19: qty 2, fill #0
  Filled 2021-06-26 – 2021-08-27 (×3): qty 2, 28d supply, fill #0
  Filled 2021-09-19: qty 2, 28d supply, fill #1
  Filled 2021-10-16: qty 2, 28d supply, fill #2

## 2021-06-19 MED ORDER — COSENTYX SENSOREADY PEN 150 MG/ML ~~LOC~~ SOAJ: SUBCUTANEOUS | 5 refills | Status: DC

## 2021-06-26 ENCOUNTER — Other Ambulatory Visit (HOSPITAL_COMMUNITY): Payer: Self-pay

## 2021-06-26 MED FILL — Valacyclovir HCl Tab 500 MG: ORAL | 90 days supply | Qty: 90 | Fill #0 | Status: AC

## 2021-06-26 MED FILL — Secukinumab Subcutaneous Auto-inj 150 MG/ML (300 MG Dose): SUBCUTANEOUS | 28 days supply | Qty: 2 | Fill #3 | Status: AC

## 2021-07-11 ENCOUNTER — Other Ambulatory Visit: Payer: Self-pay

## 2021-07-11 ENCOUNTER — Ambulatory Visit: Payer: No Typology Code available for payment source | Admitting: Emergency Medicine

## 2021-07-11 ENCOUNTER — Encounter: Payer: Self-pay | Admitting: Emergency Medicine

## 2021-07-11 DIAGNOSIS — R918 Other nonspecific abnormal finding of lung field: Secondary | ICD-10-CM | POA: Diagnosis not present

## 2021-07-11 NOTE — Assessment & Plan Note (Signed)
Somewhat linear patchy infiltrates that have not changed on interval CT, last in June.  Biopsies consistent with granulomas, presumed sarcoidosis although autoimmune disease is possible given his history of psoriasis.  Reassuring pulmonary function testing.  Will not start bronchodilator therapy for now, follow him clinically.  Plan to repeat his CT scan of the chest at the 39-month mark to ensure stability.  If stable then we will space out his surveillance.  Considered getting an ACE level today but will hold off, consider if he has any flaring symptoms.  We reviewed your CT scan of the chest, biopsy results, culture results today. 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 We will not start any inhaled medication right now We will plan to repeat your CT scan of the chest in late December 2022 to follow for interval stability Please let us know if you have any changes in your breathing, any new rash. Need to continue to follow with dermatology and with ophthalmology for annual eye exams Follow with Dr Delton Coombes in December 2022 or January 2023 to review your CT scan of the chest together.  Call sooner if you have any new problems.

## 2021-07-11 NOTE — Addendum Note (Signed)
Addended by: Dorisann Frames R on: 07/11/2021 01:46 PM   Modules accepted: Orders

## 2021-07-11 NOTE — Patient Instructions (Signed)
We reviewed your CT scan of the chest, biopsy results, culture results today. 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 We will not start any inhaled medication right now We will plan to repeat your CT scan of the chest in late December 2022 to follow for interval stability Please let us know if you have any changes in your breathing, any new rash. Need to continue to follow with dermatology and with ophthalmology for annual eye exams Follow with Dr Delton Coombes in December 2022 or January 2023 to review your CT scan of the chest together.  Call sooner if you have any new problems.

## 2021-07-11 NOTE — Progress Notes (Signed)
Subjective:    Patient ID: Jared Stewart, male    DOB: [birth year], 50 y.o.   MRN: [id number]  HPI  ROV 11/03/20 --follow-up visit 50 year old gentleman with psoriasis and eczema on Cosentyx, OSA.  He had an incidental finding of bilateral rounded opacities on CT chest as he was being evaluated for possible kidney transplantation donation for his brother.  CT chest from [facility] as above. Repeat CT chest 10/25/2020 reviewed by me, shows persistent bandlike opacities with some wedge-shaped consolidative change into the right upper lobe with some hilar distortion and volume loss, associated micro nodularity, 4 x 2.3 cm in greatest dimension.  Also a bandlike area in the left upper lobe 3 x 1.9 cm.  Subcarinal nodal enlargement 1.5 cm.   ROV 12/13/20 --Ms. Jared Stewart is 50 with a history of psoriasis and eczema on Cosentyx.  Also with OSA.  We have evaluated him for rounded opacities in the chest that were found on CT, persistent on scan 10/25/2020 with some bandlike opacities and wedge-shaped consolidation in the right upper lobe, micro nodularity in a bandlike left upper lobe region with some associated subcarinal lymphadenopathy.  Dr. Tonia Stewart performed bronchoscopy on 1/25.  Samples were obtained in the right upper lobe, right lower lobe, left upper lobe, station 7 node.  No malignant cells identified at any target, vague granulomatous inflammation with associated foreign body type giant cells (AFB and fungal stains negative).  There was granulomatous inflammation in the station 7 lymph node. All culture data is negative so far as are smears.    ROV 07/11/21 --50 year old gentleman with a history of psoriasis/eczema on Cosentyx + valtrex.  He also has a history of OSA.  We have been monitoring we will do bilateral chest opacities that have persisted on serial CT scans of the chest with some bandlike opacities and wedge-shaped consolidation especially in the right upper lobe, micro nodularity in the left upper  lobe.  Biopsies showed granulomatous inflammation, all culture negative, all consistent with sarcoidosis (versus autoimmune disease given his psoriasis). He is feeling well. Denies any shortness of breath, wheeze, chest pain. His psoriasis is resolved on the Cosentyx, but he may be having some B elbow, L shoulder pain / arthralgias.    Hypersensitive pneumonitis panel 12/13/2020 >> negative Repeat CT chest done 04/16/2021 reviewed by me, shows no significant change in his bandlike areas of opacity, some groundglass and micronodular disease compared with 10/25/2020.  Pulmonary function testing 01/03/2021 reviewed by me, shows grossly normal airflows without a bronchodilator response, some evidence for possible restriction based on the decreased RV, normal diffusion capacity.   Review of Systems As per HPI      Objective:   Physical Exam Vitals:   07/11/21 1015  BP: 126/80  Pulse: 78  Temp: 97.9 F (36.6 C)  TempSrc: Oral  SpO2: 97%  Weight: 203 lb 6.4 oz (92.3 kg)  Height: 6' (1.829 m)    Gen: Pleasant, well-nourished, in no distress,  normal affect  ENT: No lesions,  mouth clear,  oropharynx clear, no postnasal drip  Neck: No JVD, no stridor  Lungs: No use of accessory muscles, no crackles or wheezing on normal respiration, no wheeze on forced expiration  Cardiovascular: RRR, heart sounds normal, no murmur or gallops, no peripheral edema  Musculoskeletal: His right knee is in an immobilizer post surgery  Neuro: alert, awake, non focal  Skin: Warm, no lesions or rash     Assessment & Plan:  Pulmonary nodules/lesions, multiple Somewhat linear patchy infiltrates  that have not changed on interval CT, last in June.  Biopsies consistent with granulomas, presumed sarcoidosis although autoimmune disease is possible given his history of psoriasis.  Reassuring pulmonary function testing.  Will not start bronchodilator therapy for now, follow him clinically.  Plan to repeat his CT  scan of the chest at the 59-month mark to ensure stability.  If stable then we will space out his surveillance.  Considered getting an ACE level today but will hold off, consider if he has any flaring symptoms.  We reviewed your CT scan of the chest, biopsy results, culture results today. 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 We will not start any inhaled medication right now We will plan to repeat your CT scan of the chest in late December 2022 to follow for interval stability Please let us know if you have any changes in your breathing, any new rash. Need to continue to follow with dermatology and with ophthalmology for annual eye exams Follow with Dr Delton Coombes in December 2022 or January 2023 to review your CT scan of the chest together.  Call sooner if you have any new problems.   Time spent 50 minutes  Jared Pupa, MD, PhD 07/11/2021, 10:38 AM [facility] [phone] or if no answer [phone]

## 2021-07-20 ENCOUNTER — Other Ambulatory Visit (HOSPITAL_COMMUNITY): Payer: Self-pay

## 2021-07-20 MED FILL — Secukinumab Subcutaneous Auto-inj 150 MG/ML (300 MG Dose): SUBCUTANEOUS | 28 days supply | Qty: 2 | Fill #4 | Status: AC

## 2021-07-25 ENCOUNTER — Other Ambulatory Visit (HOSPITAL_COMMUNITY): Payer: Self-pay

## 2021-08-17 ENCOUNTER — Other Ambulatory Visit (HOSPITAL_COMMUNITY): Payer: Self-pay

## 2021-08-22 ENCOUNTER — Other Ambulatory Visit (HOSPITAL_COMMUNITY): Payer: Self-pay

## 2021-08-23 ENCOUNTER — Other Ambulatory Visit (HOSPITAL_COMMUNITY): Payer: Self-pay

## 2021-08-24 ENCOUNTER — Other Ambulatory Visit (HOSPITAL_COMMUNITY): Payer: Self-pay

## 2021-08-27 ENCOUNTER — Other Ambulatory Visit (HOSPITAL_COMMUNITY): Payer: Self-pay

## 2021-08-28 ENCOUNTER — Other Ambulatory Visit (HOSPITAL_COMMUNITY): Payer: Self-pay

## 2021-08-29 ENCOUNTER — Other Ambulatory Visit (HOSPITAL_COMMUNITY): Payer: Self-pay

## 2021-08-29 ENCOUNTER — Other Ambulatory Visit: Payer: Self-pay | Admitting: Pharmacist

## 2021-08-29 MED ORDER — COSENTYX SENSOREADY PEN 150 MG/ML ~~LOC~~ SOAJ
SUBCUTANEOUS | 3 refills | Status: DC
  Filled 2021-08-29: qty 2, fill #0

## 2021-08-29 MED ORDER — COSENTYX SENSOREADY PEN 150 MG/ML ~~LOC~~ SOAJ: SUBCUTANEOUS | 3 refills | Status: DC

## 2021-08-29 MED ORDER — COSENTYX SENSOREADY PEN 150 MG/ML ~~LOC~~ SOAJ
SUBCUTANEOUS | 3 refills | Status: AC
  Filled 2021-08-29: qty 2, fill #0

## 2021-08-29 MED FILL — Secukinumab Subcutaneous Soln Auto-injector 150 MG/ML: SUBCUTANEOUS | 28 days supply | Qty: 2 | Fill #0 | Status: CN

## 2021-08-31 ENCOUNTER — Other Ambulatory Visit (HOSPITAL_COMMUNITY): Payer: Self-pay

## 2021-09-07 ENCOUNTER — Other Ambulatory Visit (HOSPITAL_COMMUNITY): Payer: Self-pay

## 2021-09-17 ENCOUNTER — Other Ambulatory Visit (HOSPITAL_COMMUNITY): Payer: Self-pay

## 2021-09-19 ENCOUNTER — Other Ambulatory Visit (HOSPITAL_COMMUNITY): Payer: Self-pay

## 2021-09-25 ENCOUNTER — Other Ambulatory Visit (HOSPITAL_COMMUNITY): Payer: Self-pay

## 2021-09-26 ENCOUNTER — Other Ambulatory Visit (HOSPITAL_COMMUNITY): Payer: Self-pay

## 2021-09-27 ENCOUNTER — Other Ambulatory Visit (HOSPITAL_COMMUNITY): Payer: Self-pay

## 2021-09-28 ENCOUNTER — Other Ambulatory Visit (HOSPITAL_COMMUNITY): Payer: Self-pay

## 2021-10-08 ENCOUNTER — Other Ambulatory Visit (HOSPITAL_COMMUNITY): Payer: Self-pay

## 2021-10-11 ENCOUNTER — Ambulatory Visit (INDEPENDENT_AMBULATORY_CARE_PROVIDER_SITE_OTHER)
Admission: RE | Admit: 2021-10-11 | Discharge: 2021-10-11 | Disposition: A | Discharge status: Home / Self Care | Payer: No Typology Code available for payment source | Source: Ambulatory Visit | Attending: Emergency Medicine | Admitting: Emergency Medicine

## 2021-10-11 ENCOUNTER — Other Ambulatory Visit: Payer: Self-pay

## 2021-10-11 DIAGNOSIS — R918 Other nonspecific abnormal finding of lung field: Secondary | ICD-10-CM

## 2021-10-15 ENCOUNTER — Encounter: Payer: Self-pay | Admitting: Emergency Medicine

## 2021-10-15 NOTE — Telephone Encounter (Signed)
RB please advise. Thanks.  

## 2021-10-16 ENCOUNTER — Other Ambulatory Visit (HOSPITAL_COMMUNITY): Payer: Self-pay

## 2021-10-17 ENCOUNTER — Other Ambulatory Visit (HOSPITAL_COMMUNITY): Payer: Self-pay

## 2021-10-18 ENCOUNTER — Other Ambulatory Visit (HOSPITAL_COMMUNITY): Payer: Self-pay

## 2021-11-19 ENCOUNTER — Other Ambulatory Visit (HOSPITAL_COMMUNITY): Payer: Self-pay

## 2021-12-28 IMAGING — CT CT CHEST W/O CM
2 of 4 series · 15 of 36 positions shown, 18 images · non-contrast
Comparison: 10/25/2020

CLINICAL DATA: Possible sarcoidosis

EXAM:
CT CHEST WITHOUT CONTRAST
TECHNIQUE: Multidetector CT imaging of the chest was performed following the
standard protocol without IV contrast.

[Series 2: thorax · axial · 0.73mm/px · z∈[-323,-37]mm · 12 of 171 slices shown, 15 images]
[im 14/171  mediastinal]
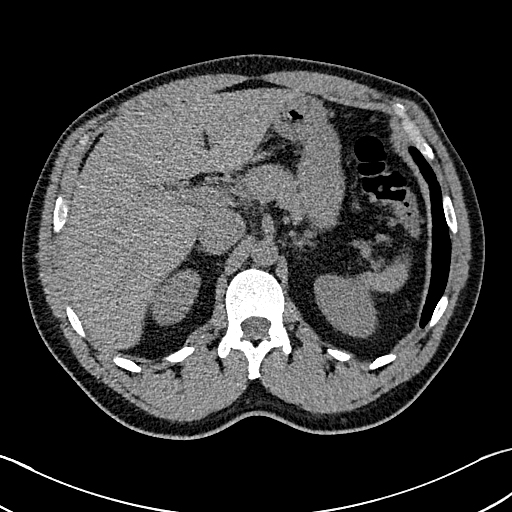
[im 14/171  lung]
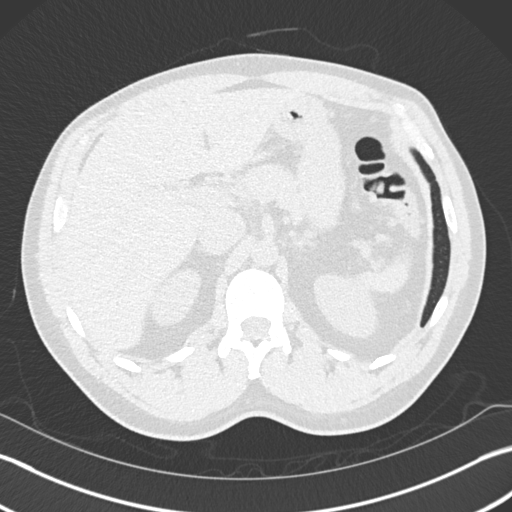
[im 27/171  lung]
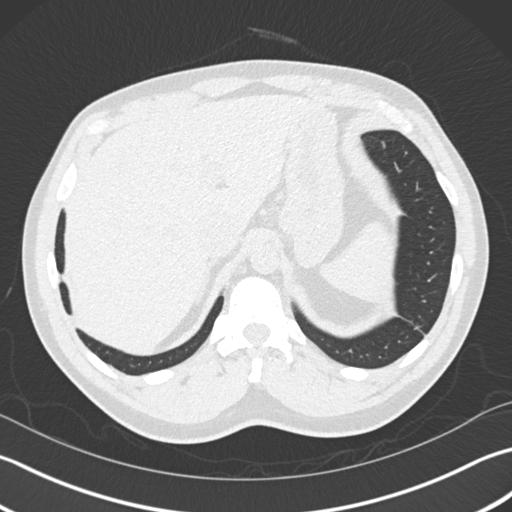
[im 40/171  lung]
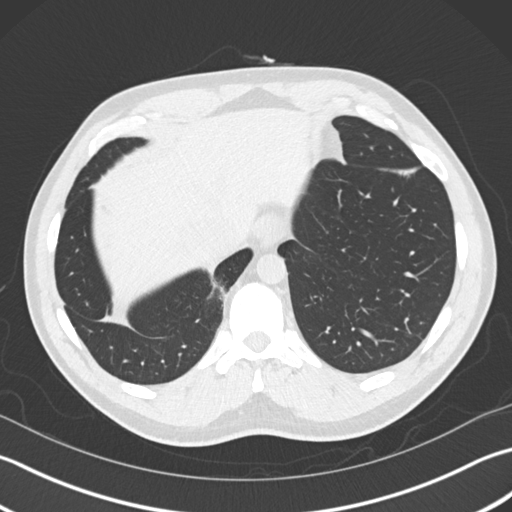
[im 53/171  lung]
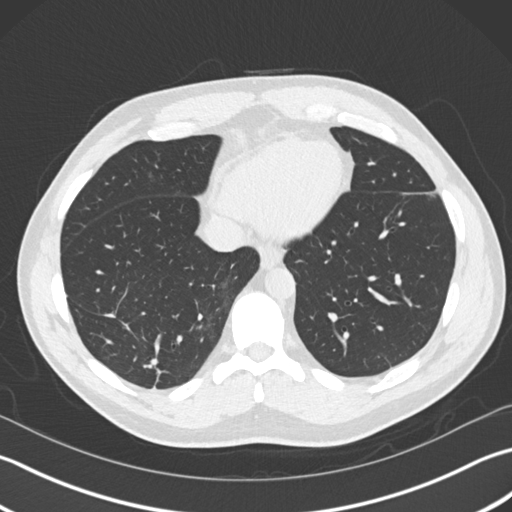
[im 66/171  mediastinal]
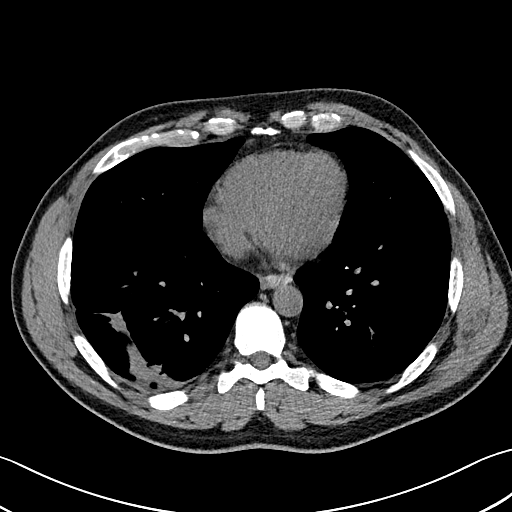
[im 66/171  lung]
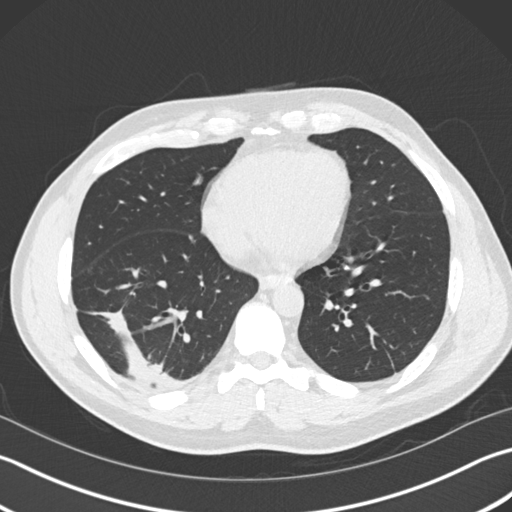
[im 79/171  lung]
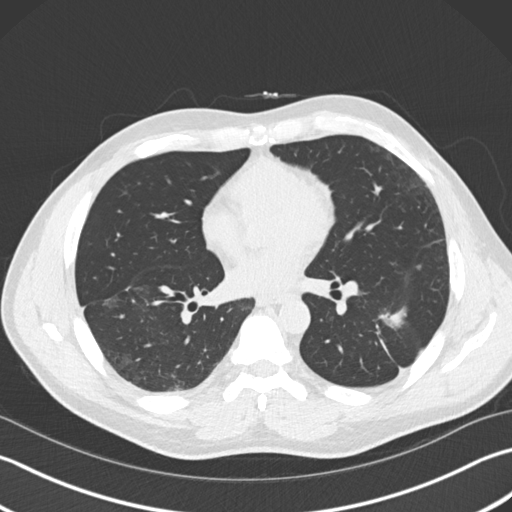
[im 92/171  lung]
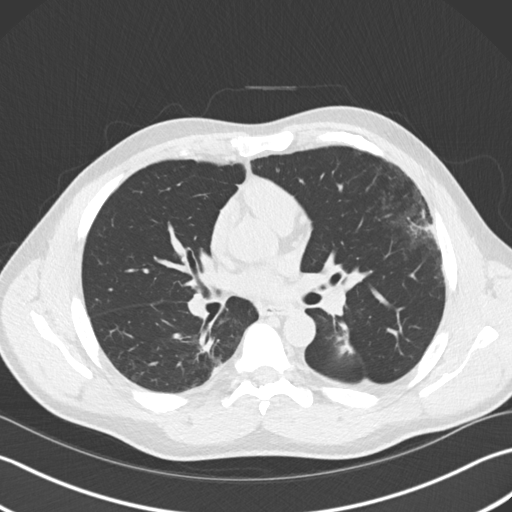
[im 105/171  lung]
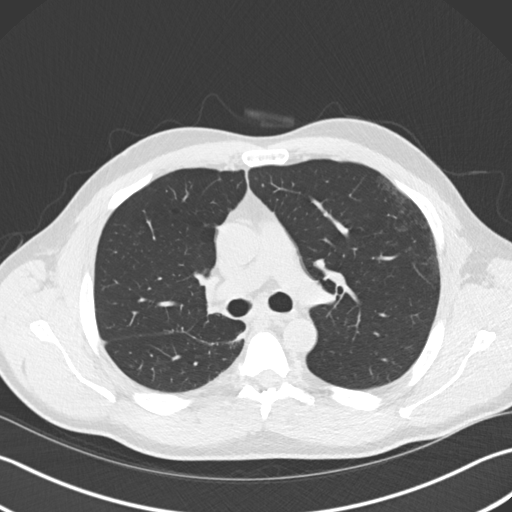
[im 118/171  mediastinal]
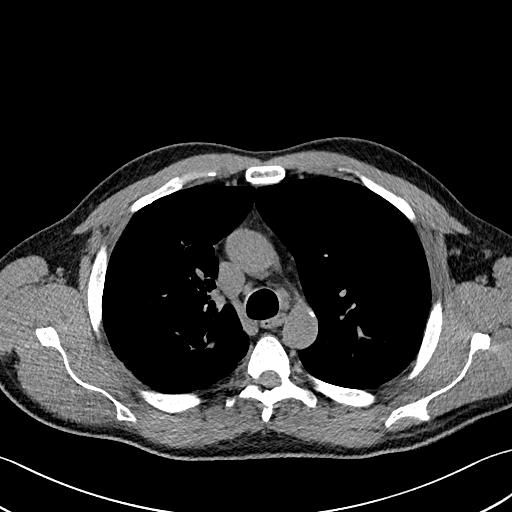
[im 118/171  lung]
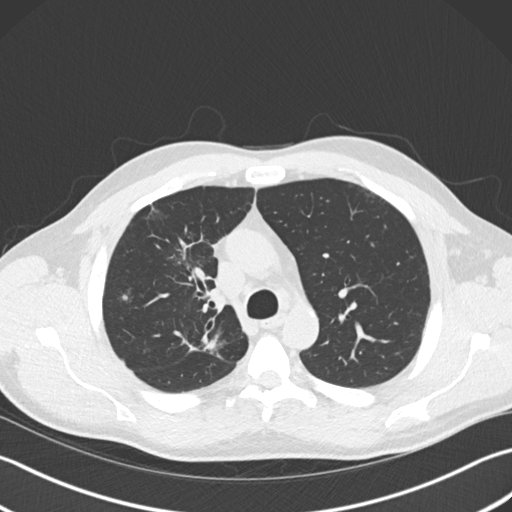
[im 131/171  lung]
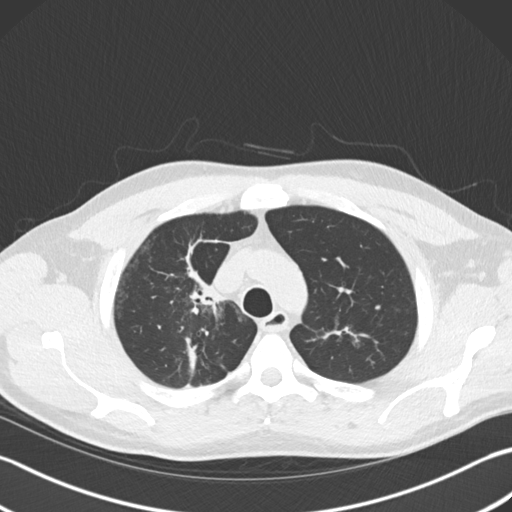
[im 144/171  lung]
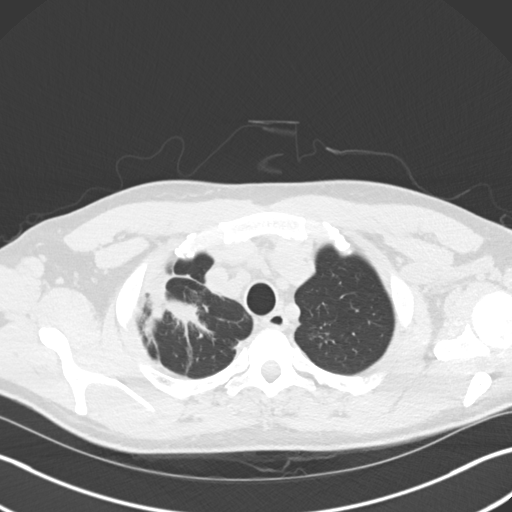
[im 157/171  lung]
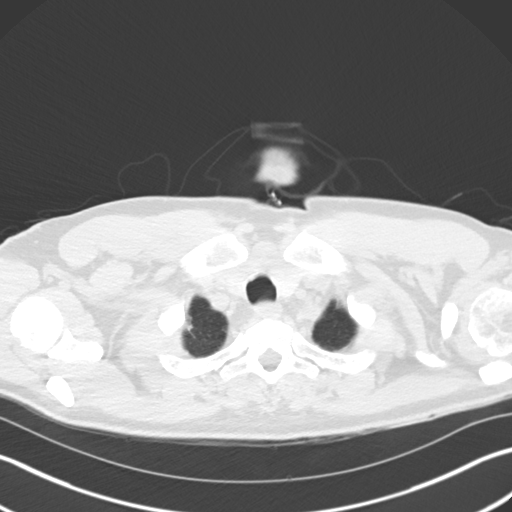

[Series 5: coronal · coronal · 0.67mm/px · 3 of 126 slices shown]
[im 26/126  lung]
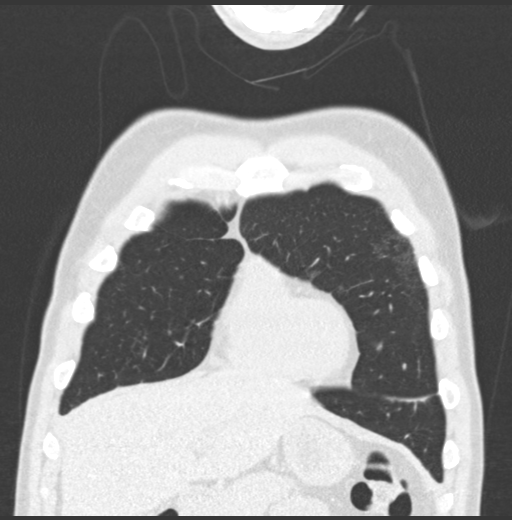
[im 51/126  lung]
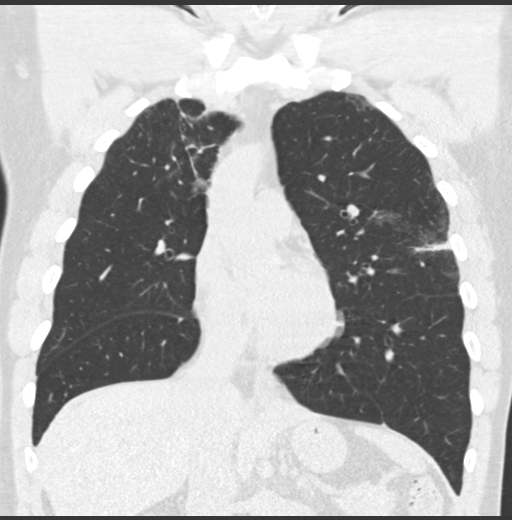
[im 76/126  lung]
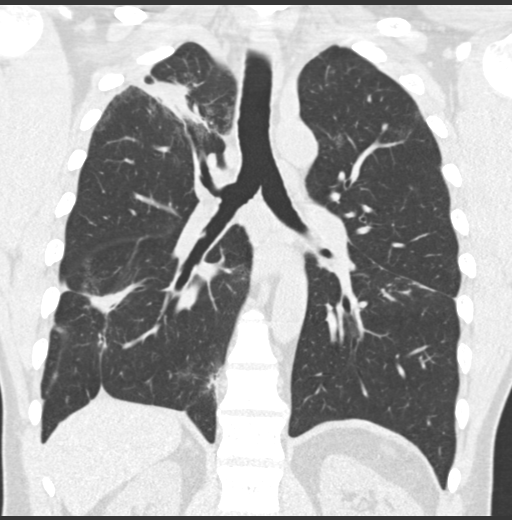

[15 of 36 positions shown; findings below may reference images not displayed]

FINDINGS: Cardiovascular: No significant vascular findings. Normal heart size.
No pericardial effusion.

Mediastinum/Nodes: Unchanged prominent mediastinal lymph nodes.
Thyroid gland, trachea, and esophagus demonstrate no significant
findings.

Lungs/Pleura: There are multiple areas of consolidation and
associated architectural distortion throughout the lungs, most
conspicuously in the right pulmonary apex (series 3, image 32) and
right lower lobe (series 3, image 105). There appears to be some
very fine associated nodularity and ground-glass. There are
occasional small pulmonary nodules measuring 4 mm and smaller,
stable and definitively benign, for example in the right upper lobe
(series 3, image 54). No pleural effusion or pneumothorax.

Upper Abdomen: No acute abnormality.

Musculoskeletal: No chest wall mass or suspicious bone lesions
identified.
IMPRESSION: 1. There are multiple areas of consolidation and associated
architectural distortion throughout the lungs, most conspicuously in
the right pulmonary apex and right lower lobe. There appears to be
some very fine associated nodularity and ground-glass. This
constellation of findings is unchanged compared to prior examination
and of uncertain significance, not typical in appearance or
distribution for pulmonary sarcoidosis nor pneumoconiosis, although
somewhat suggestive of massive fibrosis related to either of these
entities. Parenchymal biopsy may be helpful to assess for etiology.
2. There are occasional small pulmonary nodules measuring 4 mm and
smaller, stable and definitively benign. No further follow-up or
characterization is specifically required for these nodules.
3. Unchanged prominent mediastinal lymph nodes, nonspecific although
likely reactive.

## 2022-01-02 ENCOUNTER — Other Ambulatory Visit (HOSPITAL_COMMUNITY): Payer: Self-pay

## 2022-01-02 MED ORDER — VALACYCLOVIR HCL 500 MG PO TABS
500.0000 mg | ORAL_TABLET | Freq: Every day | ORAL | 1 refills | Status: AC
Start: 2022-01-02 — End: ?
  Filled 2022-01-02 – 2022-01-15 (×2): qty 90, 90d supply, fill #0

## 2022-01-03 ENCOUNTER — Other Ambulatory Visit (HOSPITAL_COMMUNITY): Payer: Self-pay

## 2022-01-09 ENCOUNTER — Other Ambulatory Visit (HOSPITAL_COMMUNITY): Payer: Self-pay

## 2022-01-10 ENCOUNTER — Other Ambulatory Visit (HOSPITAL_COMMUNITY): Payer: Self-pay

## 2022-01-15 ENCOUNTER — Other Ambulatory Visit (HOSPITAL_COMMUNITY): Payer: Self-pay

## 2022-03-07 ENCOUNTER — Other Ambulatory Visit (HOSPITAL_COMMUNITY): Payer: Self-pay

## 2022-03-07 MED ORDER — VALACYCLOVIR HCL 500 MG PO TABS
500.0000 mg | ORAL_TABLET | Freq: Every day | ORAL | 4 refills | Status: AC
  Filled 2022-03-07 – 2022-06-03 (×2): qty 90, 90d supply, fill #0

## 2022-04-09 ENCOUNTER — Ambulatory Visit: Payer: No Typology Code available for payment source | Admitting: Dermatology

## 2022-05-08 ENCOUNTER — Telehealth: Payer: Self-pay | Admitting: Pharmacist

## 2022-05-08 NOTE — Telephone Encounter (Signed)
Called patient to schedule an appointment for the [employer] [facility]. I was unable to reach the patient so I left a HIPAA-compliant message requesting that the patient return my call.   Luke Van Ausdall, PharmD, BCACP, CPP Clinical Pharmacist [facility] [phone]  

## 2022-06-03 ENCOUNTER — Other Ambulatory Visit (HOSPITAL_COMMUNITY): Payer: Self-pay

## 2022-06-24 IMAGING — CT CT CHEST SUPER D W/O CM
2 of 5 series · 15 of 36 positions shown, 18 images · non-contrast
Comparison: Chest CT 04/16/2021.

CLINICAL DATA: 50-year-old male with history of pulmonary nodules.
Follow-up study.

EXAM:
CT CHEST WITHOUT CONTRAST
TECHNIQUE: Multidetector CT imaging of the chest was performed using thin slice
collimation for electromagnetic bronchoscopy planning purposes,
without intravenous contrast.

[Series 4: thins · axial · 0.70mm/px · z∈[-330,-47]mm · 12 of 409 slices shown, 15 images]
[im 28/409  mediastinal]
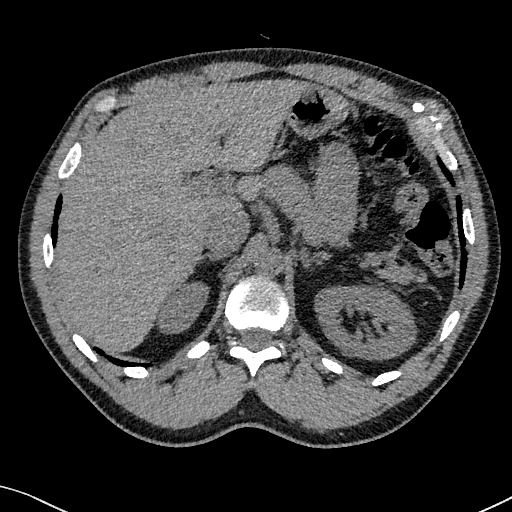
[im 28/409  lung]
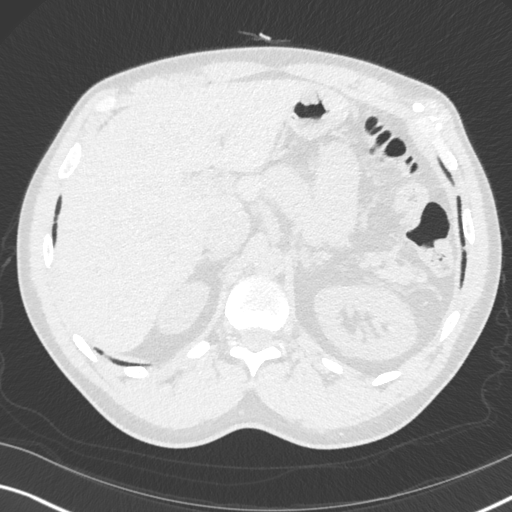
[im 55/409  lung]
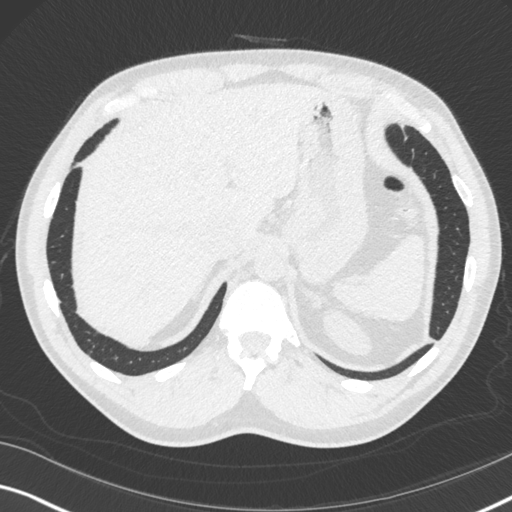
[im 82/409  lung]
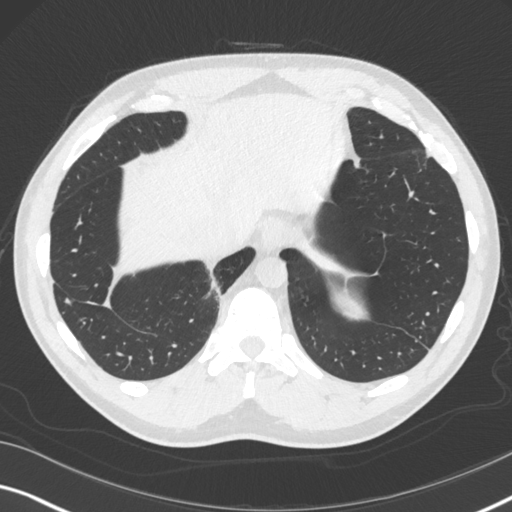
[im 137/409  lung]
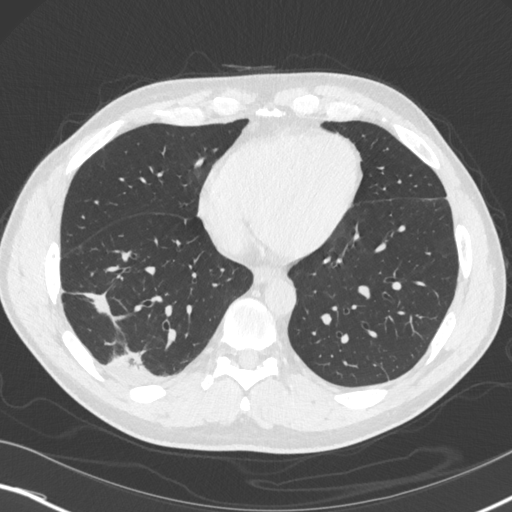
[im 164/409  mediastinal]
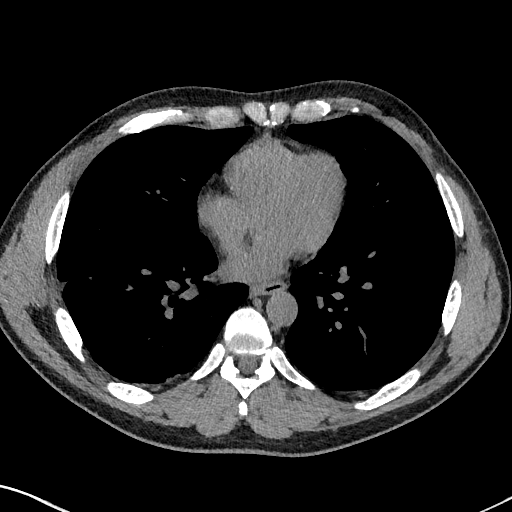
[im 164/409  lung]
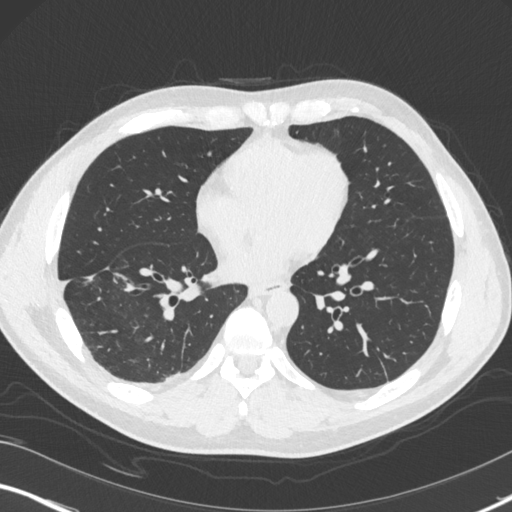
[im 191/409  lung]
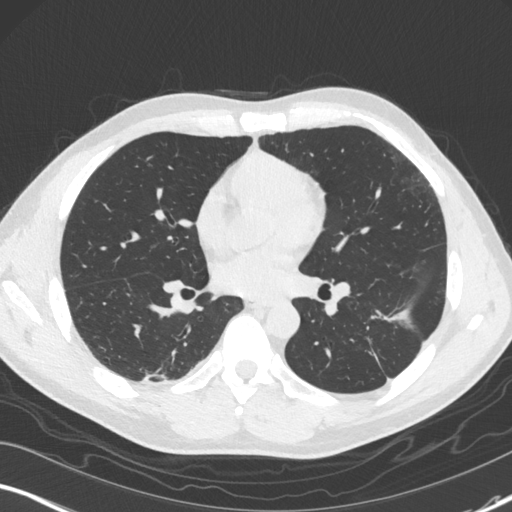
[im 218/409  lung]
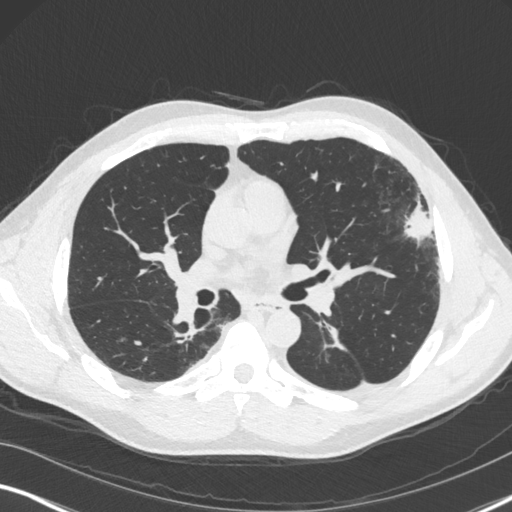
[im 245/409  lung]
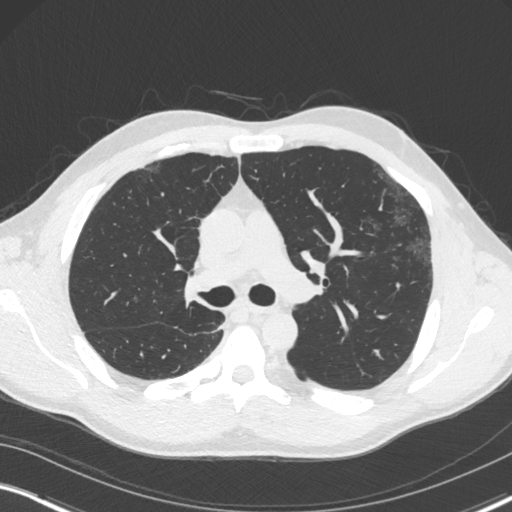
[im 273/409  mediastinal]
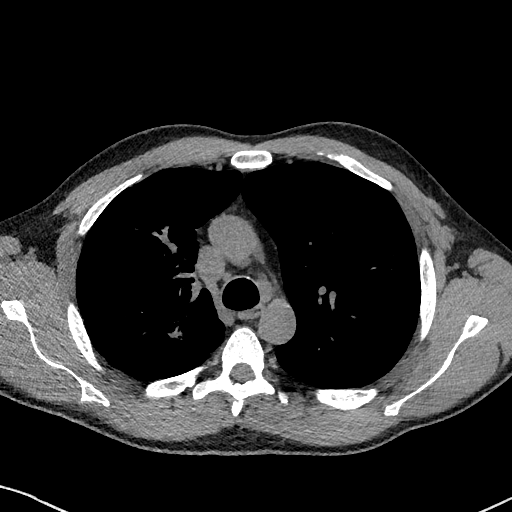
[im 273/409  lung]
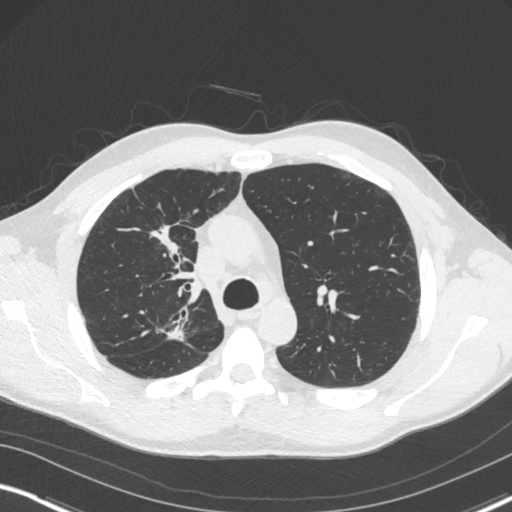
[im 327/409  lung]
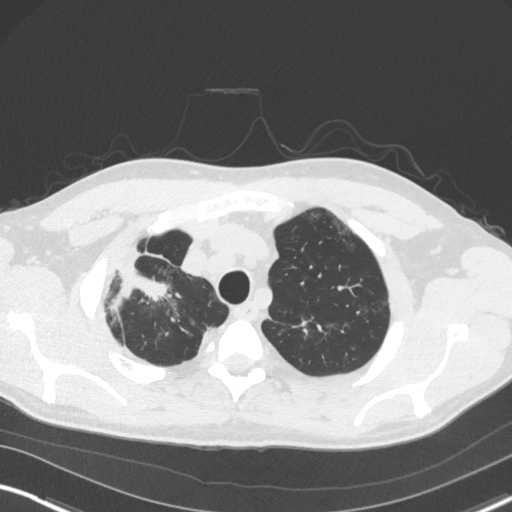
[im 354/409  lung]
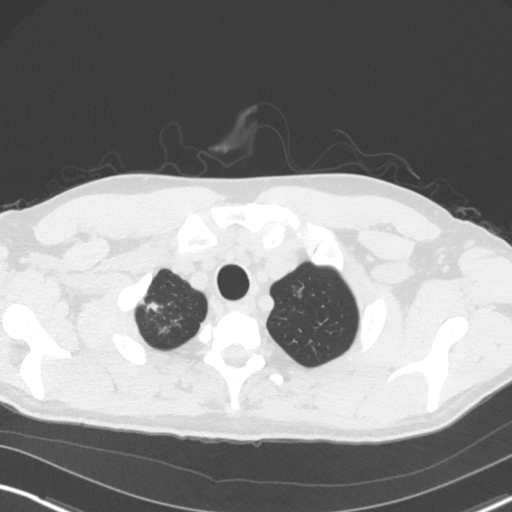
[im 381/409  lung]
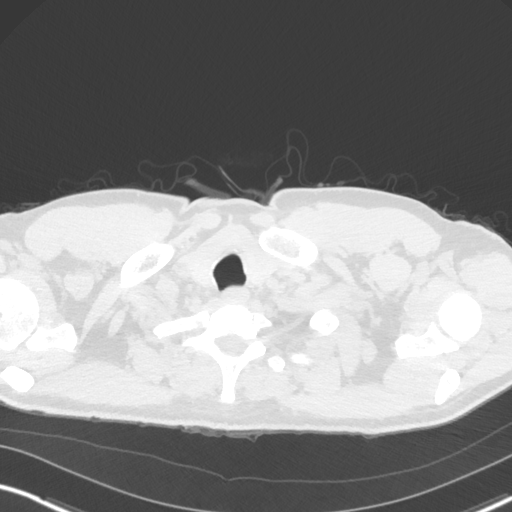

[Series 5: coronal · coronal · 0.64mm/px · 3 of 77 slices shown]
[im 16/77  lung]
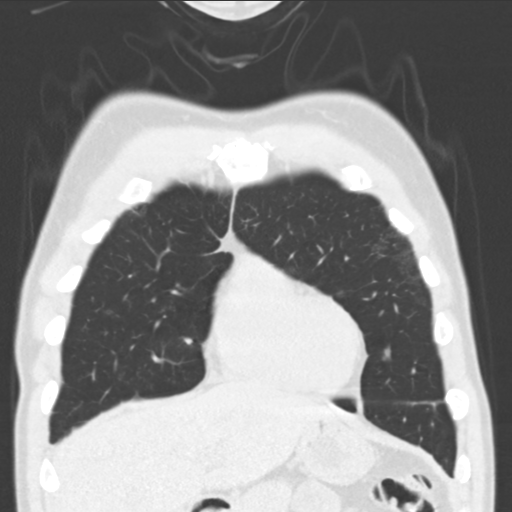
[im 31/77  lung]
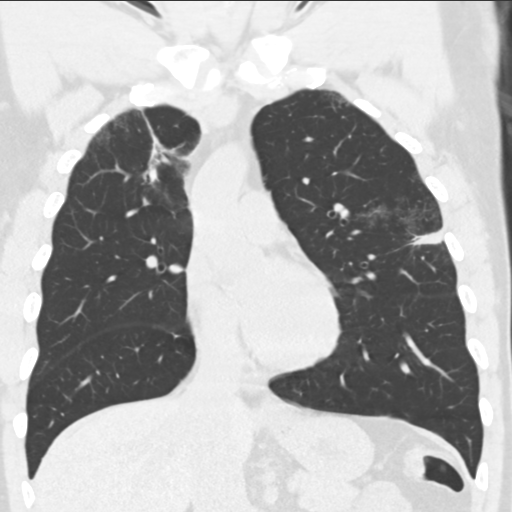
[im 46/77  lung]
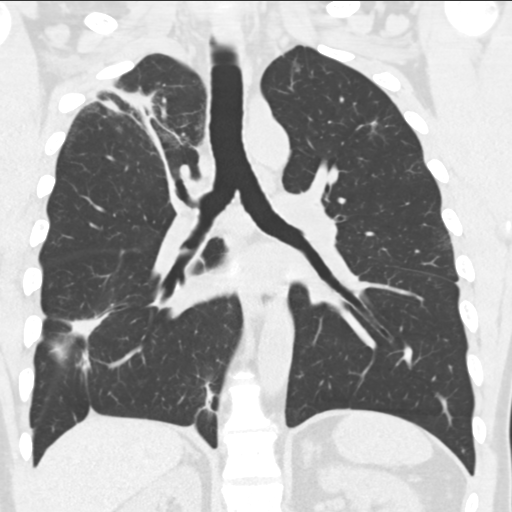

[15 of 36 positions shown; findings below may reference images not displayed]

FINDINGS: Cardiovascular: Heart size is normal. There is no significant
pericardial fluid, thickening or pericardial calcification. Aortic
atherosclerosis. No definite coronary artery calcifications.

Mediastinum/Nodes: No pathologically enlarged mediastinal or hilar
lymph nodes. Esophagus is unremarkable in appearance. No axillary
lymphadenopathy.

Lungs/Pleura: Again noted are some patchy areas of ground-glass
attenuation and very fine septal thickening scattered throughout the
lungs bilaterally, most evident in the mid to upper lungs. There
also several nodular and mass-like areas of architectural distortion
in the lungs bilaterally, similar in size and number to the prior
examination, most notably a 2.6 x 1.9 cm nodular area in the
periphery of the left upper lobe (axial image 78 of series 3), and a
3.2 x 2.3 cm mass-like area in the right upper lobe near the apex
(axial image 38 of series 3). No acute consolidative airspace
disease. No pleural effusions.

Upper Abdomen: Unremarkable.

Musculoskeletal: There are no aggressive appearing lytic or blastic
lesions noted in the visualized portions of the skeleton.
IMPRESSION: 1. Today's examination is very similar to the recent prior
examination, most notable for scattered areas of nodular and
mass-like architectural distortion in the lungs bilaterally. These
findings are of uncertain etiology and significance, and correlation
with lung biopsy to establish a tissue diagnosis is suggested.
2. No lymphadenopathy noted.
3. Aortic atherosclerosis.

Aortic Atherosclerosis ([id number]).

## 2022-07-23 ENCOUNTER — Other Ambulatory Visit (HOSPITAL_COMMUNITY): Payer: Self-pay

## 2022-07-23 MED ORDER — COSENTYX SENSOREADY (300 MG) 150 MG/ML ~~LOC~~ SOAJ
SUBCUTANEOUS | 5 refills | Status: AC
  Filled 2022-07-23 – 2022-08-28 (×3): qty 2, 28d supply, fill #0
  Filled 2022-10-01: qty 2, 28d supply, fill #1
  Filled 2022-10-16: qty 2, 28d supply, fill #2
  Filled 2022-12-27 – 2023-01-08 (×2): qty 2, 28d supply, fill #3

## 2022-07-29 ENCOUNTER — Other Ambulatory Visit (HOSPITAL_COMMUNITY): Payer: Self-pay

## 2022-08-26 ENCOUNTER — Other Ambulatory Visit (HOSPITAL_COMMUNITY): Payer: Self-pay

## 2022-08-28 ENCOUNTER — Other Ambulatory Visit (HOSPITAL_COMMUNITY): Payer: Self-pay

## 2022-09-16 ENCOUNTER — Other Ambulatory Visit (HOSPITAL_COMMUNITY): Payer: Self-pay

## 2022-09-18 ENCOUNTER — Other Ambulatory Visit (HOSPITAL_COMMUNITY): Payer: Self-pay

## 2022-09-20 ENCOUNTER — Other Ambulatory Visit (HOSPITAL_COMMUNITY): Payer: Self-pay

## 2022-10-01 ENCOUNTER — Other Ambulatory Visit (HOSPITAL_COMMUNITY): Payer: Self-pay

## 2022-10-02 ENCOUNTER — Other Ambulatory Visit (HOSPITAL_COMMUNITY): Payer: Self-pay

## 2022-10-16 ENCOUNTER — Other Ambulatory Visit (HOSPITAL_COMMUNITY): Payer: Self-pay

## 2022-10-16 MED ORDER — PAXLOVID (300/100) 20 X 150 MG & 10 X 100MG PO TBPK
3.0000 | ORAL_TABLET | Freq: Two times a day (BID) | ORAL | 0 refills | Status: AC
  Filled 2022-10-16: qty 30, 5d supply, fill #0

## 2022-10-22 ENCOUNTER — Other Ambulatory Visit: Payer: Self-pay

## 2022-10-22 ENCOUNTER — Other Ambulatory Visit (HOSPITAL_COMMUNITY): Payer: Self-pay

## 2022-10-22 ENCOUNTER — Other Ambulatory Visit (HOSPITAL_BASED_OUTPATIENT_CLINIC_OR_DEPARTMENT_OTHER): Payer: Self-pay

## 2022-10-23 ENCOUNTER — Other Ambulatory Visit (HOSPITAL_COMMUNITY): Payer: Self-pay

## 2022-11-18 ENCOUNTER — Other Ambulatory Visit (HOSPITAL_COMMUNITY): Payer: Self-pay

## 2022-11-19 ENCOUNTER — Other Ambulatory Visit (HOSPITAL_COMMUNITY): Payer: Self-pay

## 2022-12-19 ENCOUNTER — Other Ambulatory Visit (HOSPITAL_COMMUNITY): Payer: Self-pay

## 2022-12-27 ENCOUNTER — Other Ambulatory Visit (HOSPITAL_COMMUNITY): Payer: Self-pay

## 2023-01-08 ENCOUNTER — Other Ambulatory Visit (HOSPITAL_COMMUNITY): Payer: Self-pay

## 2023-01-09 ENCOUNTER — Other Ambulatory Visit (HOSPITAL_COMMUNITY): Payer: Self-pay

## 2023-01-10 ENCOUNTER — Telehealth: Payer: Self-pay | Admitting: Emergency Medicine

## 2023-01-10 NOTE — Telephone Encounter (Signed)
Rcvd a Mychart message from Pt and so I called him to get scheduled for his ppt, Pt said he ust wants to know if Dr. Lamonte Sakai needs to see him since its been since 2022

## 2023-01-13 NOTE — Telephone Encounter (Signed)
Called and spoke with pt letting him know per last OV with RB 06/2021, RB wanted him to be seen December 2022/ January 2023 after last CT.  When tried to make pt an appt, pt said he would have to check with his spouse's work schedule and then call us back.    **When pt calls back, please schedule pt an appt with Dr. Lamonte Sakai. This can be made in a lung nodule spot on Dr. Agustina Caroli schedule**

## 2023-01-14 NOTE — Telephone Encounter (Signed)
Pt's spouse returned call and I scheduled pt a f/u with RB. Nothing further needed.

## 2023-01-21 ENCOUNTER — Encounter: Payer: Self-pay | Admitting: Emergency Medicine

## 2023-01-21 ENCOUNTER — Ambulatory Visit (INDEPENDENT_AMBULATORY_CARE_PROVIDER_SITE_OTHER): Payer: No Typology Code available for payment source | Admitting: Emergency Medicine

## 2023-01-21 VITALS — BP 120/74 | HR 72 | Temp 98.5°F | Ht 72.0 in | Wt 203.4 lb

## 2023-01-21 DIAGNOSIS — D869 Sarcoidosis, unspecified: Secondary | ICD-10-CM

## 2023-01-21 NOTE — Assessment & Plan Note (Signed)
Doing quite well.  No symptoms.  He has not had any repeat imaging.  We will perform a CT in December (2-year mark).  I do not think we need to schedule PFT right now given his great functional capacity.  We can determine the timing going forward.  Talked him today about vaccines, about appropriate ophthalmology follow-up.  It is good to see you again. We will plan to repeat a CT scan of the chest without contrast to follow sarcoidosis in December 2024.  This can be done in the [facility] system and we will obtain images from them to compare with your priors. We can talk about the timing of any repeat pulmonary function testing at your next visit Remember to see ophthalmology once yearly Keep your flu shot, COVID-19 vaccines up-to-date Follow Dr. Lamonte Sakai in December 2024 to review your CT scan results.  Call sooner if you have any problems.

## 2023-01-21 NOTE — Progress Notes (Signed)
Subjective:    Patient ID: Jared Stewart, male    DOB: 05-07-1971, 52 y.o.   MRN: [id number]  HPI  ROV 07/11/21 --52 year old gentleman with a history of psoriasis/eczema on Cosentyx + valtrex.  He also has a history of OSA.  We have been monitoring we will do bilateral chest opacities that have persisted on serial CT scans of the chest with some bandlike opacities and wedge-shaped consolidation especially in the right upper lobe, micro nodularity in the left upper lobe.  Biopsies showed granulomatous inflammation, all culture negative, all consistent with sarcoidosis (versus autoimmune disease given his psoriasis). He is feeling well. Denies any shortness of breath, wheeze, chest pain. His psoriasis is resolved on the Cosentyx, but he may be having some B elbow, L shoulder pain / arthralgias.    Hypersensitive pneumonitis panel 12/13/2020 >> negative Repeat CT chest done 04/16/2021 reviewed by me, shows no significant change in his bandlike areas of opacity, some groundglass and micronodular disease compared with 10/25/2020.  Pulmonary function testing 01/03/2021 reviewed by me, shows grossly normal airflows without a bronchodilator response, some evidence for possible restriction based on the decreased RV, normal diffusion capacity.  ROV 01/21/2023 --52 year old man whom I have followed for pulmonary nodular disease and granulomatous inflammation on bronchoscopy all consistent with sarcoidosis.  He also has a history of psoriasis/eczema on Cosentyx, history of OSA.  Negative hypersensitivity pneumonitis panel in the past.  Pulmonary function testing has shown grossly normal airflows without a bronchodilator response.  He returns today to reestablish care.  He had lost his insurance for a period of time. Today he reports no rash, no adenopathy. No SOB, stays very active. No cough.    Review of Systems As per HPI     Objective:   Physical Exam Vitals:   01/21/23 0942  BP: 120/74  Pulse: 72   Temp: 98.5 F (36.9 C)  TempSrc: Oral  SpO2: 97%  Weight: 203 lb 6.4 oz (92.3 kg)  Height: 6' (1.829 m)    Gen: Pleasant, well-nourished, in no distress,  normal affect  ENT: No lesions,  mouth clear,  oropharynx clear, no postnasal drip  Neck: No JVD, no stridor  Lungs: No use of accessory muscles, no crackles or wheezing on normal respiration, no wheeze on forced expiration  Cardiovascular: RRR, heart sounds normal, no murmur or gallops, no peripheral edema  Musculoskeletal: No deformities  Neuro: alert, awake, non focal  Skin: Warm, no lesions or rash     Assessment & Plan:  Sarcoidosis Doing quite well.  No symptoms.  He has not had any repeat imaging.  We will perform a CT in December (2-year mark).  I do not think we need to schedule PFT right now given his great functional capacity.  We can determine the timing going forward.  Talked him today about vaccines, about appropriate ophthalmology follow-up.  It is good to see you again. We will plan to repeat a CT scan of the chest without contrast to follow sarcoidosis in December 2024.  This can be done in the [facility] system and we will obtain images from them to compare with your priors. We can talk about the timing of any repeat pulmonary function testing at your next visit Remember to see ophthalmology once yearly Keep your flu shot, COVID-19 vaccines up-to-date Follow Dr. Lamonte Sakai in December 2024 to review your CT scan results.  Call sooner if you have any problems.    Baltazar Apo, MD, PhD 01/21/2023, 9:59 AM  [facility] [phone] or if no answer [phone]

## 2023-01-21 NOTE — Addendum Note (Signed)
Addended by: Gavin Potters R on: 01/21/2023 10:32 AM   Modules accepted: Orders

## 2023-01-21 NOTE — Patient Instructions (Signed)
It is good to see you again. We will plan to repeat a CT scan of the chest without contrast to follow sarcoidosis in December 2024.  This can be done in the [facility] system and we will obtain images from them to compare with your priors. We can talk about the timing of any repeat pulmonary function testing at your next visit Remember to see ophthalmology once yearly Keep your flu shot, COVID-19 vaccines up-to-date Follow Dr. Lamonte Sakai in December 2024 to review your CT scan results.  Call sooner if you have any problems.

## 2023-05-09 ENCOUNTER — Other Ambulatory Visit (HOSPITAL_COMMUNITY): Payer: Self-pay

## 2023-05-09 MED ORDER — PERMETHRIN 5 % EX CREA
TOPICAL_CREAM | CUTANEOUS | 1 refills | Status: AC
  Filled 2023-05-09: qty 60, 1d supply, fill #0

## 2023-07-19 ENCOUNTER — Encounter (HOSPITAL_COMMUNITY): Payer: Self-pay

## 2023-09-03 ENCOUNTER — Telehealth: Payer: Self-pay | Admitting: Emergency Medicine

## 2023-09-03 NOTE — Telephone Encounter (Signed)
Patient is currently schedule for a CT scan with [facility] but has a change in insurance. His wife is wondering if he can have his CT scan done in [place] that has a cheaper rate for their new insurance. Please call and advise.  [facility]  Fax: [phone] Phone number: [phone]

## 2023-09-17 NOTE — Telephone Encounter (Signed)
Patient is currently scheduled for [facility] but due to a change of insurance the needs for his CT Scan to be done at [facility].

## 2023-09-23 NOTE — Telephone Encounter (Signed)
Patient's wife called back and states the Imaging Center does not have the CT referral. Fax number [phone], phone number for imaging center is [phone].

## 2023-09-24 ENCOUNTER — Other Ambulatory Visit: Payer: Self-pay

## 2023-10-03 ENCOUNTER — Other Ambulatory Visit: Payer: No Typology Code available for payment source

## 2023-10-09 ENCOUNTER — Telehealth: Payer: No Typology Code available for payment source | Admitting: Emergency Medicine

## 2023-10-09 ENCOUNTER — Encounter: Payer: Self-pay | Admitting: Emergency Medicine

## 2023-10-09 DIAGNOSIS — D869 Sarcoidosis, unspecified: Secondary | ICD-10-CM

## 2023-10-09 NOTE — Assessment & Plan Note (Signed)
His CT scan of the chest sounds stable or improved based on the report from 10/08/2023.  That we will get the images and I will review.  Given his clinical stability and the apparent improvement on CT we can plan to follow him annually clinically and radiographically.  He will call me if he has a clinical change and we will image him sooner.  Discussed with him the need to connect with ophthalmology and to ensure that they know he has sarcoidosis.

## 2023-10-09 NOTE — Progress Notes (Signed)
Virtual Visit via Video Note  I connected with Jared Stewart on 10/09/23 at 10:00 AM EST by a video enabled telemedicine application and verified that I am speaking with the correct person using two identifiers.  Location: Patient: Home Provider: Office   I discussed the limitations of evaluation and management by telemedicine and the availability of in person appointments. The patient expressed understanding and agreed to proceed.  History of Present Illness: Jared Stewart is 35 with a history of psoriasis/eczema on Cosentyx.  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  He also has OSA.  He underwent a surveillance CT scan of the chest in [place] on 10/08/2023.  He is not on any bronchodilator therapy at this time.   He reports that he is doing well - does work, not limited by breathing. No new resp sx. No rash or neuro sx. he has seen an optometrist but not ophthalmologist   Observations/Objective: Pulmonary function testing 06/2021 reassuring without any obvious obstruction.  CT scan of the chest was done at [facility] radiology.  I have the report available but not the images.  There were bilateral upper lobe predominant perilymphatic micronodules a nodular thickening of the fissures associated with some architectural distortion.  Numerous small and borderline prominent lymph nodes in the mediastinum including a left paratracheal node 2.9 cm, 1.5 cm subcarinal node.  This sounds like it is probably an improvement compared with his prior from 10/11/2021 at which time the [facility] fissural infiltrates were more focal and consolidated the bilateral upper lobes  Assessment and Plan: Sarcoidosis His CT scan of the chest sounds stable or improved based on the report from 10/08/2023.  That we will get the images and I will review.  Given his clinical stability and the apparent improvement on CT we can plan to  follow him annually clinically and radiographically.  He will call me if he has a clinical change and we will image him sooner.  Discussed with him the need to connect with ophthalmology and to ensure that they know he has sarcoidosis.   Follow Up Instructions: 12 mon   I discussed the assessment and treatment plan with the patient. The patient was provided an opportunity to ask questions and all were answered. The patient agreed with the plan and demonstrated an understanding of the instructions.   The patient was advised to call back or seek an in-person evaluation if the symptoms worsen or if the condition fails to improve as anticipated.  I provided 17 minutes of non-face-to-face time during this encounter.   Leslye Peer, MD

## 2023-10-10 ENCOUNTER — Telehealth: Payer: No Typology Code available for payment source | Admitting: Emergency Medicine

## 2023-10-17 ENCOUNTER — Other Ambulatory Visit: Payer: No Typology Code available for payment source

## 2024-04-25 ENCOUNTER — Other Ambulatory Visit (HOSPITAL_COMMUNITY): Payer: Self-pay

## 2024-04-26 ENCOUNTER — Other Ambulatory Visit (HOSPITAL_COMMUNITY): Payer: Self-pay

## 2024-08-03 ENCOUNTER — Telehealth: Payer: Self-pay | Admitting: Emergency Medicine

## 2024-08-03 NOTE — Telephone Encounter (Signed)
 Hey Dr.Byrum, patient would like to know if he still needs CT scan and if it's important. He is fine with waiting another a year.

## 2024-08-04 NOTE — Telephone Encounter (Signed)
 He has been stable, doing well I would be okay with deferring his repeat CT scan of the chest for another year.

## 2024-08-05 ENCOUNTER — Other Ambulatory Visit

## 2024-08-05 NOTE — Telephone Encounter (Signed)
 Okay, I will let the patient know.  Thanks!

## 2024-09-21 ENCOUNTER — Other Ambulatory Visit (HOSPITAL_COMMUNITY): Payer: Self-pay

## 2024-11-22 ENCOUNTER — Other Ambulatory Visit: Payer: Self-pay
